# Patient Record
Sex: Female | Born: 1980 | Race: White | Hispanic: No | Marital: Married | Smoking: Never smoker
Health system: Southern US, Community
[De-identification: ages and names within clinical notes are randomized; demographics above are authoritative.]

## PROBLEM LIST (undated history)

## (undated) DIAGNOSIS — J302 Other seasonal allergic rhinitis: Secondary | ICD-10-CM

## (undated) DIAGNOSIS — K200 Eosinophilic esophagitis: Secondary | ICD-10-CM

## (undated) DIAGNOSIS — C439 Malignant melanoma of skin, unspecified: Secondary | ICD-10-CM

## (undated) HISTORY — DX: Eosinophilic esophagitis: K20.0

## (undated) HISTORY — DX: Malignant melanoma of skin, unspecified: C43.9

## (undated) HISTORY — DX: Other seasonal allergic rhinitis: J30.2

## (undated) HISTORY — PX: MELANOMA EXCISION: SHX5266

---

## 2005-04-23 DIAGNOSIS — C439 Malignant melanoma of skin, unspecified: Secondary | ICD-10-CM

## 2005-04-23 HISTORY — DX: Malignant melanoma of skin, unspecified: C43.9

## 2017-11-29 ENCOUNTER — Encounter: Payer: Self-pay | Admitting: Allergy

## 2017-11-29 ENCOUNTER — Ambulatory Visit (INDEPENDENT_AMBULATORY_CARE_PROVIDER_SITE_OTHER): Payer: BC Managed Care – PPO | Admitting: Allergy

## 2017-11-29 VITALS — BP 100/70 | HR 75 | Temp 98.3°F | Resp 16 | Ht 64.0 in | Wt 165.2 lb

## 2017-11-29 DIAGNOSIS — J309 Allergic rhinitis, unspecified: Secondary | ICD-10-CM | POA: Diagnosis not present

## 2017-11-29 DIAGNOSIS — T63481A Toxic effect of venom of other arthropod, accidental (unintentional), initial encounter: Secondary | ICD-10-CM | POA: Diagnosis not present

## 2017-11-29 DIAGNOSIS — J4520 Mild intermittent asthma, uncomplicated: Secondary | ICD-10-CM

## 2017-11-29 DIAGNOSIS — H1010 Acute atopic conjunctivitis, unspecified eye: Secondary | ICD-10-CM

## 2017-11-29 DIAGNOSIS — K200 Eosinophilic esophagitis: Secondary | ICD-10-CM

## 2017-11-29 MED ORDER — AUVI-Q 0.3 MG/0.3ML IJ SOAJ: 0.3000 mg | Freq: Once | INTRAMUSCULAR | 1 refills | Status: AC

## 2017-11-29 MED ORDER — ALBUTEROL SULFATE HFA 108 (90 BASE) MCG/ACT IN AERS: 2.0000 | INHALATION_SPRAY | Freq: Four times a day (QID) | RESPIRATORY_TRACT | 2 refills | Status: DC | PRN

## 2017-11-29 NOTE — Progress Notes (Signed)
New Patient Note  RE: Lauren Fletcher MRN: [id number] DOB: [birth year] Date of Office Visit: 11/29/2017  Referring provider: No ref. provider found Primary care provider: No primary care provider on file.  Chief Complaint: bee sting  History of present illness: Lauren Fletcher is a 37 y.o. female presenting today for evaluation of bee sting.   She was stung June 30th on left posterior thigh.  She had large swelling and redness that progressed over 2-3 days after sting.  Swelling did not cross hip or knee joint.  She denies having any remote areas involves.  Denies any GI, respiratory or CV related symptoms after sting.  She states she took benadryl and applied a topical product to help reduce itch and pain but states it did not help.   She believes she was stung by a wasp.   She states she had been stung before this incident but did not have any symptoms except for very small local redness and swelling at sting site.  She is nervous that she may have a more severe reaction with next sting.    She does report symptoms of sneezing, itchy eyes, nasal congestion and pressure that can be year-round.  She will use nasal spray (flonase), claritin and mucinex to help with control of symptoms.  She states these medications work well enough for her.    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  She reports having SOB and chest tightness related to allergen exposures.  She has had an albuterol inhaler due SOB and chest tightness which is relieved with albuterol.  She states she has not required use of albuterol in over a year however.  Denies any nighttime awakenings.  Denies any need for oral steroids, ED or urgent care visits or hospitalizations.  She does have a history of eczema that is exacerbated by stress.   She normally lets the eczema flare run its course and does not apply anything topically other than moisturization.  She has had esophageal dilation in college around 4132 for  eosinophilic esophagitis.  She does not have an established GI here at this time.  Foods that she reports having issues with our bananas, liquid/juice of beans, dairy, nuts (PB) and states that she will have trouble swallowing reflect these foods are getting impacted.    She is originally from [place] and moved to [place] area about 3 yrs ago.   Review of systems: Review of Systems  Constitutional: Negative for chills, fever and malaise/fatigue.  HENT: Negative for congestion, ear discharge, ear pain, nosebleeds and sore throat.   Eyes: Negative for pain, discharge and redness.  Respiratory: Negative for cough, shortness of breath and wheezing.   Cardiovascular: Negative for chest pain.  Gastrointestinal: Negative for abdominal pain, constipation, diarrhea, heartburn, nausea and vomiting.  Musculoskeletal: Negative for joint pain.  Skin: Negative for itching and rash.  Neurological: Negative for headaches.    All other systems negative unless noted above in HPI  Past medical history: Past Medical History:  Diagnosis Date  . Eosinophilic esophagitis   . Melanoma ([place])   . Seasonal allergies     Past surgical history: Past Surgical History:  Procedure Laterality Date  . MELANOMA EXCISION     Laser surgery    Family history:  History reviewed. No pertinent family history.  Social history: She lives in a home with out carpeting with electric heating and central cooling.  There are no pets in the home there is no concern for water  damage, mildew or roaches in the home.  She is a [employer].  She denies a smoking history.  Medication List: Allergies as of 11/29/2017   No Known Allergies     Medication List    as of 11/29/2017 12:11 PM   You have not been prescribed any medications.     Known medication allergies: No Known Allergies   Physical examination: Blood pressure 100/70, pulse 75, temperature 98.3 F (36.8 C), temperature source Oral, resp. rate 16,  height 5\' 4"  (1.626 m), weight 165 lb 3.2 oz (74.9 kg), last menstrual period 11/15/2017, SpO2 99 %.  General: Alert, interactive, in no acute distress. HEENT: PERRLA, TMs pearly gray, turbinates minimally edematous without discharge, post-pharynx non erythematous. Neck: Supple without lymphadenopathy. Lungs: Clear to auscultation without wheezing, rhonchi or rales. {no increased work of breathing. CV: Normal S1, S2 without murmurs. Abdomen: Nondistended, nontender. Skin: Warm and dry, without lesions or rashes. Extremities:  No clubbing, cyanosis or edema. Neuro:   Grossly intact.  Diagnositics/Labs:  Allergy testing: environmental allergy skin prick testing is positive to grasses, weeds, trees, cats Select food allergy skin prick testing positive to milk, almond, hazelnut, pistachio, navy bean.  Negative to peanut, soybean, wheat, sesame, cashew, pecan, walnut, [place] nut, coconut, oat, hops, banana Allergy testing results were read and interpreted by provider, documented by clinical staff.   Assessment and plan:   Stinging insect large local reaction    - You had a large local reaction to a stinging insect.  You did not develop any systemic symptoms or life threatening symptoms (ie. Difficulty breathing, coughing, wheezing, vomiting, lightheadedness, passing out) following sting thus at this time you do not need to undergo testing or venom immunotherapy (venom injections).  You have the same risk as general population to develop local or systemic symptoms following a subsequent sting.   If you are stung again and do have systemic or life threatening symptoms then you would at that time warrant venom testing and immunotherapy to decrease risk of having severe reaction on subsequent stings   - will prescribe an epinephrine device as pt would like to have access to  One and provided with emergency action plan today   - for local reactions can apply ice to area, topical steroid (ie.  Hydrocortisone) to help with swelling and redness  Allergic rhinoconjunctivitis   - environmental allergy skin prick testing today is positive to weeds, grasses, trees, cat   - allergen avoidance measures discussed/handouts provided   - continue long-acting antihistamine like Claritin, Zyrtec, Allegra or Xyzal daily as needed for allergy symptom control   - for nasal congestion/drainage continue use of OTC nasal steroid spray like Flonase, Rhinocort or Nasocort 2 sprays each nostril daily for 1-2 weeks until symptoms improve    - for itchy/watery/red eyes use OTC Alaway or Zaditor 1 drop each eye 1-2 times a day as needed  Allergic asthma   - have access to albuterol inhaler 2 puffs every 4-6 hours as needed for cough/wheeze/shortness of breath/chest tightness.  May use 15-20 minutes prior to activity.   Monitor frequency of use.     - avoidance measures as above  Eosinophilic esophagitis (EoE)   - history of esophageal dilation due to EoE   - select food allergy skin prick testing today is positive to milk, almond, hazelnut, pistachio, navy bean.  Would recommend avoidance of these foods in the diet.  As above you will have access to an epipenephrine device.     -  recommend continued avoidance of foods that lead to dysphagia   - would recommend establishing care with GI  Follow-up 9-12 months or sooner if needed  I appreciate the opportunity to take part in Lauren Fletcher's care. Please do not hesitate to contact me with questions.  Sincerely,   Prudy Feeler, MD Allergy/Immunology [facility]

## 2017-11-29 NOTE — Patient Instructions (Addendum)
Stinging insect reaction    - You had a large local reaction to a stinging insect.  You did not develop any systemic symptoms or life threatening symptoms (ie. Difficulty breathing, coughing, wheezing, vomiting, lightheadedness, passing out) following sting thus at this time you do not need to undergo testing or venom immunotherapy (venom injections).  You have the same risk as general population to develop local or systemic symptoms following a subsequent sting.   If you are stung again and do have systemic or life threatening symptoms then you would at that time warrant venom testing and immunotherapy to decrease risk of having severe reaction on subsequent stings   - will prescribe an epinephrine device and provided with emergency action plan today   - for local reactions can apply ice to area, topical steroid (ie. Hydrocortisone) to help with swelling and redness  Allergies   - environmental allergy skin prick testing today is positive to weeds, grasses, trees, cat   - allergen avoidance measures discussed/handouts provided   - continue long-acting antihistamine like Claritin, Zyrtec, Allegra or Xyzal daily as needed for allergy symptom control   - for nasal congestion/drainage continue use of OTC nasal steroid spray like Flonase, Rhinocort or Nasocort 2 sprays each nostril daily for 1-2 weeks until symptoms improve    - for itchy/watery/red eyes use OTC Alaway or Zaditor 1 drop each eye 1-2 times a day as needed  Allergic asthma   - have access to albuterol inhaler 2 puffs every 4-6 hours as needed for cough/wheeze/shortness of breath/chest tightness.  May use 15-20 minutes prior to activity.   Monitor frequency of use.     - avoidance measures as above  Eosinophilic esophagitis (EoE)   - history of esophageal dilation due to EoE   - select food allergy skin prick testing today is positive to milk, almond, hazelnut, pistachio, navy bean.  Would recommend avoidance of these foods in the diet.   As above you will have access to an epipenephrine device.     - recommend continued avoidance of foods that lead to dysphagia   - would recommend establishing care with GI  Follow-up 9-12 months or sooner if needed

## 2017-12-26 ENCOUNTER — Ambulatory Visit: Payer: BC Managed Care – PPO | Admitting: Podiatry

## 2018-07-29 ENCOUNTER — Telehealth: Payer: Self-pay

## 2018-07-29 MED ORDER — E-Z SPACER DEVI: 2 refills | Status: AC

## 2018-07-29 MED ORDER — ALBUTEROL SULFATE HFA 108 (90 BASE) MCG/ACT IN AERS: 2.0000 | INHALATION_SPRAY | Freq: Four times a day (QID) | RESPIRATORY_TRACT | 1 refills | Status: AC | PRN

## 2018-07-29 NOTE — Telephone Encounter (Signed)
Patient called stating when she was seen last year the doctor offered to give her an inhaler and she told them she didn't think she needed it. Patient states with the pollen this year she really needs an inhaler.  Please Advise  [facility]

## 2019-02-23 ENCOUNTER — Other Ambulatory Visit: Payer: Self-pay

## 2019-02-23 DIAGNOSIS — Z20822 Contact with and (suspected) exposure to covid-19: Secondary | ICD-10-CM

## 2019-02-24 LAB — NOVEL CORONAVIRUS, NAA: SARS-CoV-2, NAA: NOT DETECTED

## 2019-05-27 ENCOUNTER — Ambulatory Visit: Payer: Self-pay | Attending: Internal Medicine

## 2019-05-27 DIAGNOSIS — Z20822 Contact with and (suspected) exposure to covid-19: Secondary | ICD-10-CM

## 2019-05-28 LAB — NOVEL CORONAVIRUS, NAA: SARS-CoV-2, NAA: NOT DETECTED

## 2019-06-01 ENCOUNTER — Ambulatory Visit: Payer: BC Managed Care – PPO | Attending: Internal Medicine

## 2019-06-01 DIAGNOSIS — Z20822 Contact with and (suspected) exposure to covid-19: Secondary | ICD-10-CM | POA: Insufficient documentation

## 2019-06-02 LAB — NOVEL CORONAVIRUS, NAA: SARS-CoV-2, NAA: NOT DETECTED

## 2019-12-18 ENCOUNTER — Other Ambulatory Visit: Payer: Self-pay

## 2019-12-29 ENCOUNTER — Other Ambulatory Visit: Payer: Self-pay

## 2019-12-29 ENCOUNTER — Other Ambulatory Visit: Payer: BC Managed Care – PPO

## 2019-12-29 DIAGNOSIS — Z20822 Contact with and (suspected) exposure to covid-19: Secondary | ICD-10-CM

## 2019-12-30 LAB — NOVEL CORONAVIRUS, NAA: SARS-CoV-2, NAA: NOT DETECTED

## 2020-04-05 ENCOUNTER — Other Ambulatory Visit: Payer: BC Managed Care – PPO

## 2020-04-05 DIAGNOSIS — Z20822 Contact with and (suspected) exposure to covid-19: Secondary | ICD-10-CM

## 2020-04-06 LAB — NOVEL CORONAVIRUS, NAA: SARS-CoV-2, NAA: NOT DETECTED

## 2020-04-06 LAB — SARS-COV-2, NAA 2 DAY TAT

## 2020-04-12 ENCOUNTER — Other Ambulatory Visit: Payer: BC Managed Care – PPO

## 2020-04-12 DIAGNOSIS — Z20822 Contact with and (suspected) exposure to covid-19: Secondary | ICD-10-CM

## 2020-04-13 LAB — SARS-COV-2, NAA 2 DAY TAT

## 2020-04-13 LAB — NOVEL CORONAVIRUS, NAA: SARS-CoV-2, NAA: NOT DETECTED

## 2020-04-25 ENCOUNTER — Other Ambulatory Visit: Payer: BC Managed Care – PPO

## 2020-04-25 DIAGNOSIS — Z20822 Contact with and (suspected) exposure to covid-19: Secondary | ICD-10-CM

## 2020-04-26 ENCOUNTER — Ambulatory Visit
Admission: RE | Admit: 2020-04-26 | Discharge: 2020-04-26 | Disposition: A | Discharge status: Home / Self Care | Payer: BC Managed Care – PPO | Source: Ambulatory Visit | Attending: Otolaryngology | Admitting: Otolaryngology

## 2020-04-26 ENCOUNTER — Other Ambulatory Visit: Payer: Self-pay

## 2020-04-26 ENCOUNTER — Other Ambulatory Visit: Payer: Self-pay | Admitting: Otolaryngology

## 2020-04-26 DIAGNOSIS — R590 Localized enlarged lymph nodes: Secondary | ICD-10-CM

## 2020-04-26 LAB — SARS-COV-2, NAA 2 DAY TAT

## 2020-04-26 LAB — NOVEL CORONAVIRUS, NAA: SARS-CoV-2, NAA: NOT DETECTED

## 2020-04-26 MED ORDER — IOPAMIDOL (ISOVUE-300) INJECTION 61%
75.0000 mL | Freq: Once | INTRAVENOUS | Status: AC | PRN
  Administered 2020-04-26: 75 mL via INTRAVENOUS

## 2020-04-27 ENCOUNTER — Other Ambulatory Visit (HOSPITAL_COMMUNITY)
Admission: RE | Admit: 2020-04-27 | Discharge: 2020-04-27 | Disposition: A | Discharge status: Home / Self Care | Payer: BC Managed Care – PPO | Source: Ambulatory Visit | Attending: Otolaryngology | Admitting: Otolaryngology

## 2020-04-27 DIAGNOSIS — Z20822 Contact with and (suspected) exposure to covid-19: Secondary | ICD-10-CM | POA: Insufficient documentation

## 2020-04-27 DIAGNOSIS — Z85820 Personal history of malignant melanoma of skin: Secondary | ICD-10-CM | POA: Diagnosis not present

## 2020-04-27 DIAGNOSIS — Z01812 Encounter for preprocedural laboratory examination: Secondary | ICD-10-CM | POA: Insufficient documentation

## 2020-04-27 DIAGNOSIS — I888 Other nonspecific lymphadenitis: Secondary | ICD-10-CM | POA: Diagnosis not present

## 2020-04-27 DIAGNOSIS — R590 Localized enlarged lymph nodes: Secondary | ICD-10-CM | POA: Diagnosis present

## 2020-04-27 LAB — SARS CORONAVIRUS 2 (TAT 6-24 HRS): SARS Coronavirus 2: NEGATIVE

## 2020-04-27 NOTE — H&P (Signed)
HPI:   Lauren Fletcher is a 40 y.o. female who presents as a return Patient.   Current problem: Neck nodes.  HPI: Return visit. She completed the antibiotics and does not feel any better. She feels that the swelling may have gotten worse. She had some low-grade fever the first couple of weeks during this process but no longer has that. She had some repeat blood work done which I have reviewed. Her platelet count has normalized but her white blood cell count is still low.  PMH/Meds/All/SocHx/FamHx/ROS:   History reviewed. No pertinent past medical history.  Past Surgical History:  Procedure Laterality Date  . melanoma removal   No family history of bleeding disorders, wound healing problems or difficulty with anesthesia.   Social History   Socioeconomic History  . Marital status: Married  Spouse name: Not on file  . Number of children: Not on file  . Years of education: Not on file  . Highest education level: Not on file  Occupational History  . Not on file  Tobacco Use  . Smoking status: Never Smoker  . Smokeless tobacco: Never Used  Vaping Use  . Vaping Use: Never used  Substance and Sexual Activity  . Alcohol use: Not on file  . Drug use: Not on file  . Sexual activity: Not on file  Other Topics Concern  . Not on file  Social History Narrative  . Not on file   Social Determinants of Health   Financial Resource Strain: Not on file  Food Insecurity: Not on file  Transportation Needs: Not on file  Physical Activity: Not on file  Stress: Not on file  Social Connections: Not on file  Housing Stability: Not on file   Current Outpatient Medications:  . fluocinonide (VANOS) 0.05 % cream, Apply 1 application topically., Disp: , Rfl:  . fluticasone propionate (FLONASE) 50 mcg/actuation nasal spray, , Disp: , Rfl:    Physical Exam:   On exam she is visibly upset and anxious but very healthy-appearing. Face looks normal. Breathing and voice are clear. The tender  lymphadenopathy in the right lower neck is about the same. I examined her axilla and inguinal areas as well as her abdomen and there is no additional lymphadenopathy or organomegaly palpable.  Independent Review of Additional Tests or Records:  none  Procedures:  none  Impression & Plans:  Persistent lymphadenopathy. Serologic testing for mono and Lyme were negative. She has no exposure to cats. Recommend CT imaging of the neck to further evaluate the characteristics of these nodes and then following that we will likely require either FNA or possibly open biopsy.

## 2020-04-28 ENCOUNTER — Other Ambulatory Visit: Payer: Self-pay | Admitting: Otolaryngology

## 2020-04-28 DIAGNOSIS — R590 Localized enlarged lymph nodes: Secondary | ICD-10-CM

## 2020-04-28 NOTE — Progress Notes (Signed)
SDW call made with instructions only. Verbalized understanding. Patient requested that her brother, Dr. Susy Frizzle Weisinger, be called after the surgery to be updated "since he is a doctor". Note placed in Epic and on paper chart regarding patient's request.

## 2020-04-29 ENCOUNTER — Encounter (HOSPITAL_COMMUNITY)
Admission: RE | Disposition: A | Discharge status: Home / Self Care | Payer: Self-pay | Source: Home / Self Care | Attending: Otolaryngology

## 2020-04-29 ENCOUNTER — Encounter (HOSPITAL_COMMUNITY): Payer: Self-pay | Admitting: Otolaryngology

## 2020-04-29 ENCOUNTER — Other Ambulatory Visit: Payer: Self-pay

## 2020-04-29 ENCOUNTER — Ambulatory Visit (HOSPITAL_COMMUNITY): Payer: BC Managed Care – PPO | Admitting: Certified Registered"

## 2020-04-29 ENCOUNTER — Ambulatory Visit (HOSPITAL_COMMUNITY)
Admission: RE | Admit: 2020-04-29 | Discharge: 2020-04-29 | Disposition: A | Discharge status: Home / Self Care | Payer: BC Managed Care – PPO | Attending: Otolaryngology | Admitting: Otolaryngology

## 2020-04-29 DIAGNOSIS — I888 Other nonspecific lymphadenitis: Secondary | ICD-10-CM | POA: Diagnosis not present

## 2020-04-29 DIAGNOSIS — Z20822 Contact with and (suspected) exposure to covid-19: Secondary | ICD-10-CM | POA: Insufficient documentation

## 2020-04-29 DIAGNOSIS — R590 Localized enlarged lymph nodes: Secondary | ICD-10-CM | POA: Insufficient documentation

## 2020-04-29 DIAGNOSIS — Z85820 Personal history of malignant melanoma of skin: Secondary | ICD-10-CM | POA: Insufficient documentation

## 2020-04-29 HISTORY — PX: LYMPH NODE BIOPSY: SHX201

## 2020-04-29 LAB — POCT PREGNANCY, URINE: Preg Test, Ur: NEGATIVE

## 2020-04-29 SURGERY — LYMPH NODE BIOPSY
Anesthesia: General | Laterality: Right

## 2020-04-29 MED ORDER — PHENYLEPHRINE 40 MCG/ML (10ML) SYRINGE FOR IV PUSH (FOR BLOOD PRESSURE SUPPORT)
PREFILLED_SYRINGE | INTRAVENOUS | Status: DC | PRN
  Administered 2020-04-29: 80 ug via INTRAVENOUS
  Administered 2020-04-29: 120 ug via INTRAVENOUS

## 2020-04-29 MED ORDER — PHENYLEPHRINE 40 MCG/ML (10ML) SYRINGE FOR IV PUSH (FOR BLOOD PRESSURE SUPPORT)
PREFILLED_SYRINGE | INTRAVENOUS | Status: AC
  Filled 2020-04-29: qty 10

## 2020-04-29 MED ORDER — PROPOFOL 10 MG/ML IV BOLUS
INTRAVENOUS | Status: DC | PRN
  Administered 2020-04-29: 150 mg via INTRAVENOUS

## 2020-04-29 MED ORDER — FENTANYL CITRATE (PF) 100 MCG/2ML IJ SOLN
INTRAMUSCULAR | Status: AC
  Filled 2020-04-29: qty 2

## 2020-04-29 MED ORDER — LIDOCAINE 2% (20 MG/ML) 5 ML SYRINGE
INTRAMUSCULAR | Status: AC
  Filled 2020-04-29: qty 10

## 2020-04-29 MED ORDER — CHLORHEXIDINE GLUCONATE 0.12 % MT SOLN
15.0000 mL | Freq: Once | OROMUCOSAL | Status: AC
  Administered 2020-04-29: 15 mL via OROMUCOSAL
  Filled 2020-04-29: qty 15

## 2020-04-29 MED ORDER — ONDANSETRON HCL 4 MG/2ML IJ SOLN: 4.0000 mg | Freq: Once | INTRAMUSCULAR | Status: DC | PRN

## 2020-04-29 MED ORDER — PROMETHAZINE HCL 25 MG RE SUPP: 25.0000 mg | Freq: Four times a day (QID) | RECTAL | 1 refills | Status: DC | PRN

## 2020-04-29 MED ORDER — LIDOCAINE 2% (20 MG/ML) 5 ML SYRINGE
INTRAMUSCULAR | Status: DC | PRN
  Administered 2020-04-29: 60 mg via INTRAVENOUS

## 2020-04-29 MED ORDER — DEXAMETHASONE SODIUM PHOSPHATE 10 MG/ML IJ SOLN
INTRAMUSCULAR | Status: AC
  Filled 2020-04-29: qty 2

## 2020-04-29 MED ORDER — PROPOFOL 10 MG/ML IV BOLUS
INTRAVENOUS | Status: AC
  Filled 2020-04-29: qty 20

## 2020-04-29 MED ORDER — LACTATED RINGERS IV SOLN: INTRAVENOUS | Status: DC

## 2020-04-29 MED ORDER — FENTANYL CITRATE (PF) 250 MCG/5ML IJ SOLN
INTRAMUSCULAR | Status: DC | PRN
  Administered 2020-04-29 (×2): 25 ug via INTRAVENOUS
  Administered 2020-04-29: 50 ug via INTRAVENOUS

## 2020-04-29 MED ORDER — FENTANYL CITRATE (PF) 100 MCG/2ML IJ SOLN
25.0000 ug | INTRAMUSCULAR | Status: DC | PRN
  Administered 2020-04-29: 25 ug via INTRAVENOUS

## 2020-04-29 MED ORDER — LIDOCAINE-EPINEPHRINE 1 %-1:100000 IJ SOLN
INTRAMUSCULAR | Status: AC
  Filled 2020-04-29: qty 1

## 2020-04-29 MED ORDER — MIDAZOLAM HCL 2 MG/2ML IJ SOLN
INTRAMUSCULAR | Status: AC
  Filled 2020-04-29: qty 2

## 2020-04-29 MED ORDER — MIDAZOLAM HCL 5 MG/5ML IJ SOLN
INTRAMUSCULAR | Status: DC | PRN
  Administered 2020-04-29: 2 mg via INTRAVENOUS

## 2020-04-29 MED ORDER — ONDANSETRON HCL 4 MG/2ML IJ SOLN
INTRAMUSCULAR | Status: AC
  Filled 2020-04-29: qty 2

## 2020-04-29 MED ORDER — LIDOCAINE-EPINEPHRINE 1 %-1:100000 IJ SOLN
INTRAMUSCULAR | Status: DC | PRN
  Administered 2020-04-29: 2 mL

## 2020-04-29 MED ORDER — ONDANSETRON HCL 4 MG/2ML IJ SOLN
INTRAMUSCULAR | Status: DC | PRN
  Administered 2020-04-29: 4 mg via INTRAVENOUS

## 2020-04-29 MED ORDER — HYDROCODONE-ACETAMINOPHEN 7.5-325 MG PO TABS: 1.0000 | ORAL_TABLET | Freq: Four times a day (QID) | ORAL | 0 refills | Status: DC | PRN

## 2020-04-29 MED ORDER — EPHEDRINE 5 MG/ML INJ
INTRAVENOUS | Status: AC
  Filled 2020-04-29: qty 10

## 2020-04-29 MED ORDER — FENTANYL CITRATE (PF) 250 MCG/5ML IJ SOLN
INTRAMUSCULAR | Status: AC
  Filled 2020-04-29: qty 5

## 2020-04-29 MED ORDER — DEXAMETHASONE SODIUM PHOSPHATE 10 MG/ML IJ SOLN
INTRAMUSCULAR | Status: DC | PRN
  Administered 2020-04-29: 5 mg via INTRAVENOUS

## 2020-04-29 MED ORDER — ROCURONIUM BROMIDE 10 MG/ML (PF) SYRINGE
PREFILLED_SYRINGE | INTRAVENOUS | Status: AC
  Filled 2020-04-29: qty 20

## 2020-04-29 MED ORDER — ORAL CARE MOUTH RINSE: 15.0000 mL | Freq: Once | OROMUCOSAL | Status: AC

## 2020-04-29 SURGICAL SUPPLY — 33 items
APPLIER CLIP 9.375 SM OPEN (CLIP)
CANISTER SUCT 3000ML PPV (MISCELLANEOUS) ×2 IMPLANT
CLEANER TIP ELECTROSURG 2X2 (MISCELLANEOUS) ×2 IMPLANT
CLIP APPLIE 9.375 SM OPEN (CLIP) IMPLANT
CNTNR URN SCR LID CUP LEK RST (MISCELLANEOUS) ×1 IMPLANT
CONT SPEC 4OZ STRL OR WHT (MISCELLANEOUS) ×1
CORD BIPOLAR FORCEPS 12FT (ELECTRODE) IMPLANT
COVER SURGICAL LIGHT HANDLE (MISCELLANEOUS) ×2 IMPLANT
COVER WAND RF STERILE (DRAPES) ×2 IMPLANT
DERMABOND ADVANCED (GAUZE/BANDAGES/DRESSINGS) ×1
DERMABOND ADVANCED .7 DNX12 (GAUZE/BANDAGES/DRESSINGS) ×1 IMPLANT
DRAIN HEMOVAC 7FR (DRAIN) IMPLANT
ELECT COATED BLADE 2.86 ST (ELECTRODE) ×2 IMPLANT
ELECT REM PT RETURN 9FT ADLT (ELECTROSURGICAL) ×2
ELECTRODE REM PT RTRN 9FT ADLT (ELECTROSURGICAL) ×1 IMPLANT
EVACUATOR SILICONE 100CC (DRAIN) IMPLANT
GAUZE 4X4 16PLY RFD (DISPOSABLE) IMPLANT
GLOVE ECLIPSE 7.5 STRL STRAW (GLOVE) ×2 IMPLANT
GOWN STRL REUS W/ TWL LRG LVL3 (GOWN DISPOSABLE) ×2 IMPLANT
GOWN STRL REUS W/TWL LRG LVL3 (GOWN DISPOSABLE) ×2
KIT BASIN OR (CUSTOM PROCEDURE TRAY) ×2 IMPLANT
KIT TURNOVER KIT B (KITS) ×2 IMPLANT
NEEDLE 27GAX1/2IN MONOJET (NEEDLE) ×2 IMPLANT
NS IRRIG 1000ML POUR BTL (IV SOLUTION) ×2 IMPLANT
PAD ARMBOARD 7.5X6 YLW CONV (MISCELLANEOUS) ×4 IMPLANT
PENCIL FOOT CONTROL (ELECTRODE) ×2 IMPLANT
SPONGE INTESTINAL PEANUT (DISPOSABLE) IMPLANT
STAPLER VISISTAT 35W (STAPLE) ×2 IMPLANT
SUT CHROMIC 3 0 SH 27 (SUTURE) ×2 IMPLANT
SUT ETHILON 3 0 PS 1 (SUTURE) IMPLANT
SUT SILK 4 0 REEL (SUTURE) ×2 IMPLANT
TOWEL GREEN STERILE FF (TOWEL DISPOSABLE) ×2 IMPLANT
TRAY ENT MC OR (CUSTOM PROCEDURE TRAY) ×2 IMPLANT

## 2020-04-29 NOTE — Interval H&P Note (Signed)
History and Physical Interval Note:  04/29/2020 11:17 AM  Lauren Fletcher  has presented today for surgery, with the diagnosis of Acute lymphadenitis Cervical lymphadenopathy.  The various methods of treatment have been discussed with the patient and family. After consideration of risks, benefits and other options for treatment, the patient has consented to  Procedure(s): Right Neck Lymph Node Excisional Biopsy (Right) as a surgical intervention.  The patient's history has been reviewed, patient examined, no change in status, stable for surgery.  I have reviewed the patient's chart and labs.  Questions were answered to the patient's satisfaction.     [name]

## 2020-04-29 NOTE — Op Note (Signed)
OPERATIVE REPORT  DATE OF SURGERY: 04/29/2020  PATIENT:  Lauren Fletcher,  40 y.o. female  PRE-OPERATIVE DIAGNOSIS:   Cervical lymphadenopathy  POST-OPERATIVE DIAGNOSIS:   Cervical lymphadenopathy  PROCEDURE:  Procedure(s): Right Neck Lymph Node Excisional Biopsy  SURGEON:  Beckie Salts, MD  ASSISTANTS: None  ANESTHESIA:   General   EBL: 10 ml  DRAINS: None  LOCAL MEDICATIONS USED: 1% Xylocaine with epinephrine  SPECIMEN: Right level 5 cervical lymph nodes, frozen section analysis consistent with lymphoid process, favor reactive.  No carcinoma identified.  Lymphoma work-up ordered.  COUNTS:  Correct  PROCEDURE DETAILS: The patient was taken to the operating room and placed on the operating table in the supine position. Following induction of general endotracheal anesthesia, using laryngeal mask airway, the neck was prepped and draped in the standard fashion.  The previous scar from her melanoma excision years ago was identified and was marked with a marking pen.  This was overlying 2 palpable lymph nodes.  This was infiltrated with local anesthetic solution and #15 scalpel was used to incise the skin and subcutaneous tissue.  A self-retaining retractor was used for the remainder of the case.  Blunt dissection was used to dissect through the superficial fibrofatty tissue and scar tissue down towards the palpable lymph nodes.  A nerve structure was identified just deep to the lymph nodes and was felt to probably be the spinal accessory nerve and was left unmolested.  2 nodes were removed using electrocautery dissection.  These were sent for frozen section analysis.  The wound was irrigated with saline and hemostasis was completed using cautery.  The incision was closed in layers using interrupted 3-0 chromic in a running subcuticular 3-0 chromic.  Dermabond was used on the skin.  Patient was awakened extubated and transferred recovery in stable condition    PATIENT DISPOSITION:   To PACU, stable

## 2020-04-29 NOTE — Anesthesia Postprocedure Evaluation (Signed)
Anesthesia Post Note  Patient: Lauren Fletcher  Procedure(s) Performed: Right Neck Lymph Node Excisional Biopsy (Right )     Patient location during evaluation: PACU Anesthesia Type: General Level of consciousness: awake and alert, oriented and patient cooperative Pain management: pain level controlled Vital Signs Assessment: post-procedure vital signs reviewed and stable Respiratory status: spontaneous breathing, nonlabored ventilation and respiratory function stable Cardiovascular status: blood pressure returned to baseline and stable Postop Assessment: no apparent nausea or vomiting Anesthetic complications: no   No complications documented.  Last Vitals:  Vitals:   04/29/20 1005 04/29/20 1250  BP: 115/79 109/79  Pulse: 84 72  Resp: 18 12  Temp: 36.8 C   SpO2: 99% 100%    Last Pain:  Vitals:   04/29/20 1250  TempSrc:   PainSc: 0-No pain                 Pervis Hocking

## 2020-04-29 NOTE — Anesthesia Preprocedure Evaluation (Addendum)
Anesthesia Evaluation  Patient identified by MRN, date of birth, ID band Patient awake    Reviewed: Allergy & Precautions, NPO status , Patient's Chart, lab work & pertinent test results  Airway Mallampati: II  TM Distance: >3 FB Neck ROM: Full    Dental no notable dental hx. (+) Teeth Intact   Pulmonary neg pulmonary ROS,    Pulmonary exam normal breath sounds clear to auscultation       Cardiovascular negative cardio ROS Normal cardiovascular exam Rhythm:Regular Rate:Normal     Neuro/Psych negative neurological ROS  negative psych ROS   GI/Hepatic negative GI ROS, Neg liver ROS,   Endo/Other  negative endocrine ROS  Renal/GU negative Renal ROS  negative genitourinary   Musculoskeletal Hx/o melanoma excision right neck Cervical lymphadenopathy right Psoriasis- used topicals   Abdominal   Peds  Hematology negative hematology ROS (+)   Anesthesia Other Findings Butterfly rash of face  Reproductive/Obstetrics                           Anesthesia Physical Anesthesia Plan  ASA: II  Anesthesia Plan: General   Post-op Pain Management:    Induction: Intravenous  PONV Risk Score and Plan: 3 and Treatment may vary due to age or medical condition and Ondansetron  Airway Management Planned: LMA  Additional Equipment:   Intra-op Plan:   Post-operative Plan: Extubation in OR  Informed Consent: I have reviewed the patients History and Physical, chart, labs and discussed the procedure including the risks, benefits and alternatives for the proposed anesthesia with the patient or authorized representative who has indicated his/her understanding and acceptance.     Dental advisory given  Plan Discussed with: Anesthesiologist and CRNA  Anesthesia Plan Comments:         Anesthesia Quick Evaluation

## 2020-04-29 NOTE — Transfer of Care (Signed)
Immediate Anesthesia Transfer of Care Note  Patient: Lauren Fletcher  Procedure(s) Performed: Right Neck Lymph Node Excisional Biopsy (Right )  Patient Location: PACU  Anesthesia Type:General  Level of Consciousness: awake, alert  and oriented  Airway & Oxygen Therapy: Patient Spontanous Breathing and Patient connected to nasal cannula oxygen  Post-op Assessment: Report given to RN and Post -op Vital signs reviewed and stable  Post vital signs: Reviewed and stable  Last Vitals:  Vitals Value Taken Time  BP 109/79 04/29/20 1248  Temp    Pulse 69 04/29/20 1251  Resp 13 04/29/20 1251  SpO2 100 % 04/29/20 1251  Vitals shown include unvalidated device data.  Last Pain:  Vitals:   04/29/20 1005  TempSrc: Oral      Patients Stated Pain Goal: 3 (38/45/36 4680)  Complications: No complications documented.

## 2020-04-29 NOTE — Anesthesia Procedure Notes (Signed)
Procedure Name: LMA Insertion Date/Time: 04/29/2020 11:54 AM Performed by: Imagene Riches, CRNA Pre-anesthesia Checklist: Patient identified, Emergency Drugs available, Suction available and Patient being monitored Patient Re-evaluated:Patient Re-evaluated prior to induction Oxygen Delivery Method: Circle System Utilized Preoxygenation: Pre-oxygenation with 100% oxygen Induction Type: IV induction Ventilation: Mask ventilation without difficulty LMA: LMA inserted LMA Size: 4.0 Number of attempts: 1 Airway Equipment and Method: Bite block Placement Confirmation: positive ETCO2 Tube secured with: Tape Dental Injury: Teeth and Oropharynx as per pre-operative assessment

## 2020-04-29 NOTE — Discharge Instructions (Signed)
You may shower and use soap and water. Do not use any creams, oils or ointment. ° °

## 2020-04-30 ENCOUNTER — Encounter (HOSPITAL_COMMUNITY): Payer: Self-pay | Admitting: Otolaryngology

## 2020-05-03 ENCOUNTER — Other Ambulatory Visit: Payer: Self-pay

## 2020-05-10 LAB — SURGICAL PATHOLOGY

## 2020-05-23 NOTE — Progress Notes (Addendum)
Office Visit Note  Patient: Lauren Fletcher             Date of Birth: [birth year]           MRN: [id number]             PCP: Scheryl Marten, PA Referring: [name], MD Visit Date: 06/02/2020 Occupation: @GUAROCC @  Subjective:  Lymphadenitis and pancytopenia.   History of Present Illness: Lauren Fletcher is a 40 y.o. female seen in consultation per request of Dr. Constance Holster.  According the patient on March 30, 2020 she developed some swollen lymph nodes underneath the surgical scar where she had melanoma in 2007.  She also developed increased fatigue and decreased appetite.  She had test for COVID-19 which was negative.  She also had a pregnancy test which was negative.  She was seen by her PCP who did extensive work-up including testing for EBV, RMSF and Lyme titers which were negative per patient.  She states she was also found to have a rash underneath her ear for which she was seen by dermatology PA who diagnosed it to be a psoriasis patch.  She states she had a shingles test which was negative although it was done incorrectly palpation.  She states her PCP did some lab work which showed neutropenia and thrombocytopenia.  The labs were repeated 2 weeks later and the neutropenia persists but the thrombocytopenia corrected.  She was referred to Dr. Constance Holster who did CT scan and then decided to open biopsy of the lymph node on April 29, 2020.  She states since she had the surgery she has some irritation in the scar area she also has difficulty swallowing.  For the last week she has been feeling some abdominal tightness.  She denies any diarrhea or blood in her stool.  She states the fatigue has improved.  She is also noticed a rash on her face for the last several months.  She states she had psoriasis as a child which was mostly on her scalp and her elbows.  She uses topical agents for her scalp now.  There is no family history of autoimmune disease and there is no family history of  psoriasis.  She does not have cats.  She denies any history of infection prior to onset of these symptoms.  There is no history of oral ulcers, nasal ulcers.  She states her 1 eyes dry in the morning when she gets up.  There is no history of shortness of breath, chest pain palpitations, Raynaud's phenomenon.  She gives history of photosensitivity.  She states that sometimes her left knee joint hurts but she has not noticed any swelling.  Activities of Daily Living:  Patient reports morning stiffness for 0 minutes.   Patient Denies nocturnal pain.  Difficulty dressing/grooming: Denies Difficulty climbing stairs: Denies Difficulty getting out of chair: Denies Difficulty using hands for taps, buttons, cutlery, and/or writing: Denies  Review of Systems  Constitutional: Positive for fatigue. Negative for night sweats, weight gain and weight loss.  HENT: Negative for mouth sores, trouble swallowing, trouble swallowing, mouth dryness and nose dryness.   Eyes: Positive for dryness. Negative for pain, redness, itching and visual disturbance.  Respiratory: Negative for cough, shortness of breath and difficulty breathing.   Cardiovascular: Negative for chest pain, palpitations, hypertension, irregular heartbeat and swelling in legs/feet.  Gastrointestinal: Negative for blood in stool, constipation and diarrhea.  Endocrine: Negative for increased urination.  Genitourinary: Negative for difficulty urinating and vaginal dryness.  Musculoskeletal:  Positive for arthralgias and joint pain. Negative for joint swelling, myalgias, muscle weakness, morning stiffness, muscle tenderness and myalgias.  Skin: Positive for rash and sensitivity to sunlight. Negative for color change, hair loss, skin tightness and ulcers.  Allergic/Immunologic: Negative for susceptible to infections.  Neurological: Negative for dizziness, numbness, headaches, memory loss, night sweats and weakness.  Hematological: Positive for swollen  glands. Negative for bruising/bleeding tendency.  Psychiatric/Behavioral: Positive for depressed mood. Negative for confusion and sleep disturbance. The patient is nervous/anxious.     PMFS History:  There are no problems to display for this patient.   Past Medical History:  Diagnosis Date  . Eosinophilic esophagitis   . Melanoma ([place])   . Seasonal allergies     Family History  Problem Relation Age of Onset  . Healthy Mother   . Healthy Father   . Healthy Brother    Past Surgical History:  Procedure Laterality Date  . LYMPH NODE BIOPSY Right 04/29/2020   Procedure: Right Neck Lymph Node Excisional Biopsy;  Surgeon: [name], MD;  Location: [facility];  Service: ENT;  Laterality: Right;  . MELANOMA EXCISION     Laser surgery   Social History   Social History Narrative  . Not on file   Immunization History  Administered Date(s) Administered  . PFIZER(Purple Top)SARS-COV-2 Vaccination 06/21/2019, 07/13/2019, 05/12/2020     Objective: Vital Signs: BP 123/83 (BP Location: Right Arm, Patient Position: Sitting, Cuff Size: Normal)   Pulse 67   Resp 13   Ht 5' 4.25" (1.632 m)   Wt 156 lb 12.8 oz (71.1 kg)   BMI 26.71 kg/m    Physical Exam Vitals and nursing note reviewed.  Constitutional:      Appearance: She is well-developed and well-nourished.  HENT:     Head: Normocephalic and atraumatic.  Eyes:     Extraocular Movements: EOM normal.     Conjunctiva/sclera: Conjunctivae normal.  Cardiovascular:     Rate and Rhythm: Normal rate and regular rhythm.     Pulses: Intact distal pulses.     Heart sounds: Normal heart sounds.  Pulmonary:     Effort: Pulmonary effort is normal.     Breath sounds: Normal breath sounds.  Abdominal:     General: Bowel sounds are normal.     Palpations: Abdomen is soft.  Musculoskeletal:     Cervical back: Normal range of motion.  Lymphadenopathy:     Cervical: No cervical adenopathy.  Skin:    General: Skin is warm and dry.      Capillary Refill: Capillary refill takes less than 2 seconds.     Findings: Rash present.     Comments: Facial erytheme  Neurological:     Mental Status: She is alert and oriented to person, place, and time.  Psychiatric:        Mood and Affect: Mood and affect normal.        Behavior: Behavior normal.      Musculoskeletal Exam: C-spine thoracic and lumbar spine with good range of motion.  Shoulder joints, elbow joints, wrist joints, MCPs PIPs and DIPs with good range of motion with no synovitis.  Hip joints, knee joints, ankles, MTPs and PIPs with good range of motion with no synovitis.  CDAI Exam: CDAI Score: - Patient Global: -; Provider Global: - Swollen: -; Tender: - Joint Exam 06/02/2020   No joint exam has been documented for this visit   There is currently no information documented on the homunculus. Go to the  Rheumatology activity and complete the homunculus joint exam.  Investigation: No additional findings.  Imaging: No results found.  Recent Labs: No results found for: WBC, HGB, PLT, NA, K, CL, CO2, GLUCOSE, BUN, CREATININE, BILITOT, ALKPHOS, AST, ALT, PROT, ALBUMIN, CALCIUM, GFRAA, QFTBGOLD, QFTBGOLDPLUS  Speciality Comments: No specialty comments available.  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  This could be Kikuchi disease.  Cervical lymphadenopathy -histiocytic necrotizing lymphadenitis can be postviral.  Patient had EBV, RMSF and Lyme titers done by her PCP per patient.  I will obtain some additional labs today.  Plan: Parvovirus B19 antibody, IgG and IgM, Toxoplasma gondii antibody, IgM  Rash -she has facial erythema on her face and on her forehead.  She is concerned about autoimmune disease like lupus.  I  will obtain following labs today.  Plan: Urinalysis, Routine w reflex microscopic, Sedimentation rate, CK, ANA, Anti-DNA antibody, double-stranded, Anti-Smith antibody, Parvovirus B19 antibody, IgG and IgM  Pancytopenia (HCC) -she had below white cell count and platelets initially.  The platelets recovered in 2 weeks but the neutropenia persist.  She has not had any labs since December 2021.  We will check labs today.  Her initial WBC count on April 07, 2020 was 2.8 and on December 29 was 2.6.  Her platelets on April 07, 2020 were 139 and repeat on April 20, 2020 were 192.  Plan: CBC with Differential/Platelet  History of malignant melanoma of skin - right side of the neck, 10/2005 resection. She is followed by Dr. Renda Rolls in [place].  History of psoriasis-followed by dermatologist.  Patient states she had psoriasis as a child which persist.  She has mild psoriasis in her scalp now for which she uses topical agents.  She had no psoriasis lesions present today.  Other fatigue -she initially had increased fatigue.  She states the fatigue has improved.  We will check the following labs today.  Plan: CBC with Differential/Platelet, COMPLETE METABOLIC PANEL WITH GFR, CK, TSH  Orders: Orders Placed This Encounter  Procedures  . CBC with Differential/Platelet  . COMPLETE METABOLIC PANEL WITH GFR  . Urinalysis, Routine w reflex microscopic  . Sedimentation rate  . CK  . TSH  . ANA  . Anti-DNA antibody, double-stranded  . Anti-Smith antibody  . Parvovirus B19 antibody, IgG and IgM  . Toxoplasma gondii antibody, IgM   No orders of the defined types were placed in this encounter.     Follow-Up Instructions: Return for Lymphadenitis.   Bo Merino, MD  Note - This record has been created using Editor, commissioning.  Chart creation errors have been sought, but may not always  have been located. Such creation errors do not reflect on  the standard of medical care.

## 2020-06-02 ENCOUNTER — Other Ambulatory Visit: Payer: Self-pay

## 2020-06-02 ENCOUNTER — Ambulatory Visit (INDEPENDENT_AMBULATORY_CARE_PROVIDER_SITE_OTHER): Payer: BC Managed Care – PPO | Admitting: Rheumatology

## 2020-06-02 ENCOUNTER — Encounter: Payer: Self-pay | Admitting: Rheumatology

## 2020-06-02 VITALS — BP 123/83 | HR 67 | Resp 13 | Ht 64.25 in | Wt 156.8 lb

## 2020-06-02 DIAGNOSIS — I881 Chronic lymphadenitis, except mesenteric: Secondary | ICD-10-CM

## 2020-06-02 DIAGNOSIS — R590 Localized enlarged lymph nodes: Secondary | ICD-10-CM

## 2020-06-02 DIAGNOSIS — D61818 Other pancytopenia: Secondary | ICD-10-CM

## 2020-06-02 DIAGNOSIS — R21 Rash and other nonspecific skin eruption: Secondary | ICD-10-CM

## 2020-06-02 DIAGNOSIS — Z872 Personal history of diseases of the skin and subcutaneous tissue: Secondary | ICD-10-CM

## 2020-06-02 DIAGNOSIS — Z85820 Personal history of malignant melanoma of skin: Secondary | ICD-10-CM

## 2020-06-02 DIAGNOSIS — R5383 Other fatigue: Secondary | ICD-10-CM

## 2020-06-03 ENCOUNTER — Telehealth: Payer: Self-pay | Admitting: *Deleted

## 2020-06-03 NOTE — Telephone Encounter (Signed)
Labs received from: [facility]  Drawn on: 04/20/2020 Reviewed by: Hazel Sams, PA-C  Labs drawn: CBC, CMP, Mono test, lyme IgM [place] Spotted Fever, IgM  Results: MPV: 7.1    Glucose 104    Creatinine 0.56    WBC 2.6    Neut # 1.4    Lymph # 0.80

## 2020-06-06 ENCOUNTER — Telehealth: Payer: Self-pay

## 2020-06-06 NOTE — Progress Notes (Signed)
Please add ENA, C3 and C4.

## 2020-06-06 NOTE — Progress Notes (Signed)
Office Visit Note  Patient: Lauren Fletcher             Date of Birth: 1980/07/18           MRN: [id number]             PCP: Scheryl Marten, PA Referring: Scheryl Marten, [place] Visit Date: 06/08/2020 Occupation: @GUAROCC @  Subjective:  Enlarged lymph nodes and positive ANA.   History of Present Illness: Lauren Fletcher is a 40 y.o. female with history of histiocytic necrotizing lymphadenitis and positive ANA.  She returns for follow-up visit today.  She states she continues to have some periumbilical abdominal pain which radiates to the sides.  She has seen Dr. Collene Mares for the abdominal pain and is scheduled to have a CT scan of her abdomen.  She denies any history of oral ulcers, nasal ulcers, photosensitivity, Raynaud's phenomenon or inflammatory arthritis.  She states she has been having some discomfort in her knee joints which she describes over the medial aspect of her knee off and on.  She denies any history of knee swelling.  She continues to have facial rash.  She states been diagnosed as rosacea by her dermatologist.  When she uses the products on a regular basis the rash improves.  Activities of Daily Living:  Patient reports morning stiffness for 0 minutes.   Patient Denies nocturnal pain.  Difficulty dressing/grooming: Denies Difficulty climbing stairs: Denies Difficulty getting out of chair: Denies Difficulty using hands for taps, buttons, cutlery, and/or writing: Denies  Review of Systems  Constitutional: Positive for fatigue. Negative for night sweats, weight gain and weight loss.  HENT: Negative for mouth sores, trouble swallowing, trouble swallowing, mouth dryness and nose dryness.   Eyes: Negative for pain, redness, itching, visual disturbance and dryness.  Respiratory: Negative for cough, shortness of breath and difficulty breathing.   Cardiovascular: Negative for chest pain, palpitations, hypertension, irregular heartbeat and swelling in legs/feet.   Gastrointestinal: Positive for abdominal pain. Negative for blood in stool, constipation, diarrhea, nausea and vomiting.  Endocrine: Negative for increased urination.  Genitourinary: Negative for difficulty urinating and vaginal dryness.  Musculoskeletal: Positive for arthralgias and joint pain. Negative for joint swelling, myalgias, muscle weakness, morning stiffness, muscle tenderness and myalgias.  Skin: Negative for color change, rash, hair loss, redness, skin tightness, ulcers and sensitivity to sunlight.  Allergic/Immunologic: Negative for susceptible to infections.  Neurological: Negative for dizziness, numbness, headaches, memory loss, night sweats and weakness.  Hematological: Negative for bruising/bleeding tendency and swollen glands.  Psychiatric/Behavioral: Positive for sleep disturbance. Negative for depressed mood and confusion. The patient is nervous/anxious.     PMFS History:  Patient Active Problem List   Diagnosis Date Noted  . 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 History of malignant melanoma of skin 06/08/2020  . History of psoriasis 06/08/2020  . Eosinophilic esophagitis 57/26/2035    Past Medical History:  Diagnosis Date  . Eosinophilic esophagitis   . Melanoma ([place])   . Seasonal allergies     Family History  Problem Relation Age of Onset  . Healthy Mother   . Healthy Father   . Healthy Brother    Past Surgical History:  Procedure Laterality Date  . LYMPH NODE BIOPSY Right 04/29/2020   Procedure: Right Neck Lymph Node Excisional Biopsy;  Surgeon: [name], MD;  Location: [facility];  Service: ENT;  Laterality: Right;  . MELANOMA EXCISION     Laser surgery   Social History   Social History Narrative  .  Not on file   Immunization History  Administered Date(s) Administered  . PFIZER(Purple Top)SARS-COV-2 Vaccination 06/21/2019, 07/13/2019, 05/12/2020     Objective: Vital Signs: BP 120/84 (BP Location: Left Arm, Patient Position:  Sitting, Cuff Size: Normal)   Pulse 77   Resp 13   Ht 5' 4"  (1.626 m)   Wt 157 lb 9.6 oz (71.5 kg)   BMI 27.05 kg/m    Physical Exam Vitals and nursing note reviewed.  Constitutional:      Appearance: She is well-developed and well-nourished.  HENT:     Head: Normocephalic and atraumatic.  Eyes:     Extraocular Movements: EOM normal.     Conjunctiva/sclera: Conjunctivae normal.  Cardiovascular:     Rate and Rhythm: Normal rate and regular rhythm.     Pulses: Intact distal pulses.     Heart sounds: Normal heart sounds.  Pulmonary:     Effort: Pulmonary effort is normal.     Breath sounds: Normal breath sounds.  Abdominal:     General: Bowel sounds are normal.     Palpations: Abdomen is soft.  Musculoskeletal:     Cervical back: Normal range of motion.  Lymphadenopathy:     Cervical: No cervical adenopathy.  Skin:    General: Skin is warm and dry.     Capillary Refill: Capillary refill takes less than 2 seconds.     Comments: Erythema noted over face, forehead and chin.  No nailbed capillary changes were noted.  No sclerodactyly was noted.  Neurological:     Mental Status: She is alert and oriented to person, place, and time.  Psychiatric:        Mood and Affect: Mood and affect normal.        Behavior: Behavior normal.      Musculoskeletal Exam: C-spine was in good range of motion.  Shoulder joints, elbow joints, wrist joints, MCPs PIPs and DIPs with good range of motion with no synovitis.  Hip joints, knee joints, ankles with good range of motion with no synovitis.  She had no tenderness over ankles or MTPs.  CDAI Exam: CDAI Score: -- Patient Global: --; Provider Global: -- Swollen: --; Tender: -- Joint Exam 06/08/2020   No joint exam has been documented for this visit   There is currently no information documented on the homunculus. Go to the Rheumatology activity and complete the homunculus joint exam.  Investigation: No additional findings.  Imaging: No  results found.  Recent Labs: Lab Results  Component Value Date   WBC 4.5 06/02/2020   HGB 15.1 06/02/2020   PLT 239 06/02/2020   NA 140 06/02/2020   K 4.7 06/02/2020   CL 104 06/02/2020   CO2 29 06/02/2020   GLUCOSE 84 06/02/2020   BUN 12 06/02/2020   CREATININE 0.68 06/02/2020   BILITOT 0.6 06/02/2020   AST 21 06/02/2020   ALT 18 06/02/2020   PROT 7.2 06/02/2020   CALCIUM 9.5 06/02/2020   GFRAA 130 06/02/2020   June 02, 2020 urine dipstick showed 3+ hemoglobin, 10-12 RBCs, 6-10 epithelial cells, WBC negative, bacteria negative, ANA 1: 80 NH and NS, dsDNA negative, Smith negative, parvo B19 IgG positive, IgM negative, ESR 6, CK 42, TSH normal, toxo negative Speciality Comments: No specialty comments available.  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  I had detailed discussion with the patient from all the reading I have done that it should be a self-limiting condition.  Most likely etiology could be postviral.  Cervical lymphadenopathy - Patient had EBV, RMSF and Lyme titers done by her PCP per patient.Parvovirus B19 antibody IgG was positive and IgM negative, Toxoplasma gondii antibody, IgM negative.  Patient had no recurrence of lymphadenopathy after resection.  Lab findings were discussed with patient at length.  Chronic pain of both knees-she complains of some discomfort over the medial aspect of both knees.  She had no warmth swelling effusion.  Have given her a handout on knee joint muscle strengthening exercises.  If she had persistent symptoms x-rays will be performed in the future.  Rash - facial erythema, the rash is all over her face and not typical for malar rash.  Patient states she had been evaluated by  dermatologist in the past and was diagnosed with rosacea.  When she uses topical agents the rash improves.  +ANA 1: 80 NH which is a nonspecific pattern and low titer.  Smith antibody and double-stranded DNA antibody were negative..  All other autoimmune work-up was negative.  I detailed discussion regarding the lab work with the patient.  I also discussed in case she develops any new symptoms then she should come in for evaluation.  Pancytopenia (HCC) - Neutropenia and thrombocytopenia resolved.  Most likely etiology could be postviral.  Eosinophilic esophagitis - Status post esophageal stretching 2005.  Patient states she was diagnosed with esophageal esophagitis in [place] and had stretching at age 36.  Periumbilical abdominal pain -she has been experiencing some periumbilical discomfort and bloating.  She was evaluated by Dr. Collene Mares.  She is scheduled to have CT scan of her abdomen.  History of malignant melanoma of skin - right side of the neck, 10/2005 resection. She is followed by Dr. Renda Rolls in [place].  History of psoriasis - On her scalp.  She uses topical agents.  Orders: No orders of the defined types were placed in this encounter.  No orders of the defined types were placed in this encounter.    Follow-Up Instructions: Return if symptoms worsen or fail to improve, for Histiocytic necrotizing lymphadenitis.   Bo Merino, MD  Note - This record has been created using Editor, commissioning.  Chart creation errors have been sought, but may not always  have been located. Such creation errors do not reflect on  the standard of medical care.

## 2020-06-06 NOTE — Telephone Encounter (Signed)
Patient advised per Dr. Estanislado Pandy she should ask her PCP. Patient expressed understanding.

## 2020-06-06 NOTE — Telephone Encounter (Signed)
Patient called stating she was tested for mononucleosis in December, but thinks she was tested too early and is requesting a return call to let her know if she needs to be retested.

## 2020-06-06 NOTE — Telephone Encounter (Signed)
Patient should ask her PCP.

## 2020-06-07 ENCOUNTER — Other Ambulatory Visit: Payer: Self-pay | Admitting: Gastroenterology

## 2020-06-07 DIAGNOSIS — R109 Unspecified abdominal pain: Secondary | ICD-10-CM

## 2020-06-08 ENCOUNTER — Encounter: Payer: Self-pay | Admitting: Rheumatology

## 2020-06-08 ENCOUNTER — Other Ambulatory Visit: Payer: Self-pay

## 2020-06-08 ENCOUNTER — Ambulatory Visit: Payer: BC Managed Care – PPO | Admitting: Rheumatology

## 2020-06-08 VITALS — BP 120/84 | HR 77 | Resp 13 | Ht 64.0 in | Wt 157.6 lb

## 2020-06-08 DIAGNOSIS — Z85820 Personal history of malignant melanoma of skin: Secondary | ICD-10-CM

## 2020-06-08 DIAGNOSIS — K200 Eosinophilic esophagitis: Secondary | ICD-10-CM

## 2020-06-08 DIAGNOSIS — D61818 Other pancytopenia: Secondary | ICD-10-CM

## 2020-06-08 DIAGNOSIS — I881 Chronic lymphadenitis, except mesenteric: Secondary | ICD-10-CM

## 2020-06-08 DIAGNOSIS — R1033 Periumbilical pain: Secondary | ICD-10-CM

## 2020-06-08 DIAGNOSIS — Z872 Personal history of diseases of the skin and subcutaneous tissue: Secondary | ICD-10-CM

## 2020-06-08 DIAGNOSIS — R590 Localized enlarged lymph nodes: Secondary | ICD-10-CM

## 2020-06-08 DIAGNOSIS — M25561 Pain in right knee: Secondary | ICD-10-CM | POA: Diagnosis not present

## 2020-06-08 DIAGNOSIS — R21 Rash and other nonspecific skin eruption: Secondary | ICD-10-CM | POA: Diagnosis not present

## 2020-06-08 DIAGNOSIS — M25562 Pain in left knee: Secondary | ICD-10-CM

## 2020-06-08 DIAGNOSIS — G8929 Other chronic pain: Secondary | ICD-10-CM

## 2020-06-08 LAB — URINALYSIS, ROUTINE W REFLEX MICROSCOPIC
Bacteria, UA: NONE SEEN /HPF
Bilirubin Urine: NEGATIVE
Glucose, UA: NEGATIVE
Hyaline Cast: NONE SEEN /LPF
Ketones, ur: NEGATIVE
Leukocytes,Ua: NEGATIVE
Nitrite: NEGATIVE
Protein, ur: NEGATIVE
Specific Gravity, Urine: 1.023 (ref 1.001–1.03)
WBC, UA: NONE SEEN /HPF (ref 0–5)
pH: <=5 (ref 5.0–8.0)

## 2020-06-08 LAB — SJOGREN'S SYNDROME ANTIBODS(SSA + SSB)
SSA (Ro) (ENA) Antibody, IgG: <1 AI
SSB (La) (ENA) Antibody, IgG: <1 AI

## 2020-06-08 LAB — COMPLETE METABOLIC PANEL WITH GFR
AG Ratio: 1.8 (calc) (ref 1.0–2.5)
ALT: 18 U/L (ref 6–29)
AST: 21 U/L (ref 10–30)
Albumin: 4.6 g/dL (ref 3.6–5.1)
Alkaline phosphatase (APISO): 61 U/L (ref 31–125)
BUN: 12 mg/dL (ref 7–25)
CO2: 29 mmol/L (ref 20–32)
Calcium: 9.5 mg/dL (ref 8.6–10.2)
Chloride: 104 mmol/L (ref 98–110)
Creat: 0.68 mg/dL (ref 0.50–1.10)
GFR, Est African American: 130 mL/min/{1.73_m2} (ref >=60)
GFR, Est Non African American: 112 mL/min/{1.73_m2} (ref >=60)
Globulin: 2.6 g/dL (calc) (ref 1.9–3.7)
Glucose, Bld: 84 mg/dL (ref 65–99)
Potassium: 4.7 mmol/L (ref 3.5–5.3)
Sodium: 140 mmol/L (ref 135–146)
Total Bilirubin: 0.6 mg/dL (ref 0.2–1.2)
Total Protein: 7.2 g/dL (ref 6.1–8.1)

## 2020-06-08 LAB — CBC WITH DIFFERENTIAL/PLATELET
Absolute Monocytes: 342 cells/uL (ref 200–950)
Basophils Absolute: 41 cells/uL (ref 0–200)
Basophils Relative: 0.9 %
Eosinophils Absolute: 495 cells/uL (ref 15–500)
Eosinophils Relative: 11 %
HCT: 44 % (ref 35.0–45.0)
Hemoglobin: 15.1 g/dL (ref 11.7–15.5)
Lymphs Abs: 995 cells/uL (ref 850–3900)
MCH: 32.3 pg (ref 27.0–33.0)
MCHC: 34.3 g/dL (ref 32.0–36.0)
MCV: 94.2 fL (ref 80.0–100.0)
MPV: 9.4 fL (ref 7.5–12.5)
Monocytes Relative: 7.6 %
Neutro Abs: 2628 cells/uL (ref 1500–7800)
Neutrophils Relative %: 58.4 %
Platelets: 239 10*3/uL (ref 140–400)
RBC: 4.67 10*6/uL (ref 3.80–5.10)
RDW: 12.1 % (ref 11.0–15.0)
Total Lymphocyte: 22.1 %
WBC: 4.5 10*3/uL (ref 3.8–10.8)

## 2020-06-08 LAB — TEST AUTHORIZATION

## 2020-06-08 LAB — TOXOPLASMA GONDII ANTIBODY, IGM: Toxoplasma Antibody- IgM: <8 AU/mL

## 2020-06-08 LAB — ANTI-NUCLEAR AB-TITER (ANA TITER)
ANA TITER: 1:80 {titer} — ABNORMAL HIGH
ANA Titer 1: 1:80 {titer} — ABNORMAL HIGH

## 2020-06-08 LAB — SEDIMENTATION RATE: Sed Rate: 6 mm/h (ref 0–20)

## 2020-06-08 LAB — ANTI-DNA ANTIBODY, DOUBLE-STRANDED: ds DNA Ab: <1 IU/mL

## 2020-06-08 LAB — RNP ANTIBODY: Ribonucleic Protein(ENA) Antibody, IgG: <1 AI

## 2020-06-08 LAB — CK: Total CK: 42 U/L (ref 29–143)

## 2020-06-08 LAB — C3 AND C4
C3 Complement: 144 mg/dL (ref 83–193)
C4 Complement: 22 mg/dL (ref 15–57)

## 2020-06-08 LAB — PARVOVIRUS B19 ANTIBODY, IGG AND IGM
Parvovirus B19 IgG: 7.9 — ABNORMAL HIGH (ref <0.9)
Parvovirus B19 IgM: 0 (ref <0.9)

## 2020-06-08 LAB — ANA: Anti Nuclear Antibody (ANA): POSITIVE — AB

## 2020-06-08 LAB — ANTI-SMITH ANTIBODY: ENA SM Ab Ser-aCnc: <1 AI

## 2020-06-08 LAB — TSH: TSH: 2.18 mIU/L

## 2020-06-08 LAB — ANTI-SCLERODERMA ANTIBODY: Scleroderma (Scl-70) (ENA) Antibody, IgG: <1 AI

## 2020-06-08 NOTE — Patient Instructions (Signed)
Journal for Nurse Practitioners, 15(4), 263-267. Retrieved January 27, 2018 from http://clinicalkey.com/nursing">  Knee Exercises Ask your health care provider which exercises are safe for you. Do exercises exactly as told by your health care provider and adjust them as directed. It is normal to feel mild stretching, pulling, tightness, or discomfort as you do these exercises. Stop right away if you feel sudden pain or your pain gets worse. Do not begin these exercises until told by your health care provider. Stretching and range-of-motion exercises These exercises warm up your muscles and joints and improve the movement and flexibility of your knee. These exercises also help to relieve pain and swelling. Knee extension, prone 1. Lie on your abdomen (prone position) on a bed. 2. Place your left / right knee just beyond the edge of the surface so your knee is not on the bed. You can put a towel under your left / right thigh just above your kneecap for comfort. 3. Relax your leg muscles and allow gravity to straighten your knee (extension). You should feel a stretch behind your left / right knee. 4. Hold this position for __________ seconds. 5. Scoot up so your knee is supported between repetitions. Repeat __________ times. Complete this exercise __________ times a day. Knee flexion, active 1. Lie on your back with both legs straight. If this causes back discomfort, bend your left / right knee so your foot is flat on the floor. 2. Slowly slide your left / right heel back toward your buttocks. Stop when you feel a gentle stretch in the front of your knee or thigh (flexion). 3. Hold this position for __________ seconds. 4. Slowly slide your left / right heel back to the starting position. Repeat __________ times. Complete this exercise __________ times a day.   Quadriceps stretch, prone 1. Lie on your abdomen on a firm surface, such as a bed or padded floor. 2. Bend your left / right knee and hold  your ankle. If you cannot reach your ankle or pant leg, loop a belt around your foot and grab the belt instead. 3. Gently pull your heel toward your buttocks. Your knee should not slide out to the side. You should feel a stretch in the front of your thigh and knee (quadriceps). 4. Hold this position for __________ seconds. Repeat __________ times. Complete this exercise __________ times a day.   Hamstring, supine 1. Lie on your back (supine position). 2. Loop a belt or towel over the ball of your left / right foot. The ball of your foot is on the walking surface, right under your toes. 3. Straighten your left / right knee and slowly pull on the belt to raise your leg until you feel a gentle stretch behind your knee (hamstring). ? Do not let your knee bend while you do this. ? Keep your other leg flat on the floor. 4. Hold this position for __________ seconds. Repeat __________ times. Complete this exercise __________ times a day. Strengthening exercises These exercises build strength and endurance in your knee. Endurance is the ability to use your muscles for a long time, even after they get tired. Quadriceps, isometric This exercise stretches the muscles in front of your thigh (quadriceps) without moving your knee joint (isometric). 1. Lie on your back with your left / right leg extended and your other knee bent. Put a rolled towel or small pillow under your knee if told by your health care provider. 2. Slowly tense the muscles in the front of your   left / right thigh. You should see your kneecap slide up toward your hip or see increased dimpling just above the knee. This motion will push the back of the knee toward the floor. 3. For __________ seconds, hold the muscle as tight as you can without increasing your pain. 4. Relax the muscles slowly and completely. Repeat __________ times. Complete this exercise __________ times a day.   Straight leg raises This exercise stretches the muscles in  front of your thigh (quadriceps) and the muscles that move your hips (hip flexors). 1. Lie on your back with your left / right leg extended and your other knee bent. 2. Tense the muscles in the front of your left / right thigh. You should see your kneecap slide up or see increased dimpling just above the knee. Your thigh may even shake a bit. 3. Keep these muscles tight as you raise your leg 4-6 inches (10-15 cm) off the floor. Do not let your knee bend. 4. Hold this position for __________ seconds. 5. Keep these muscles tense as you lower your leg. 6. Relax your muscles slowly and completely after each repetition. Repeat __________ times. Complete this exercise __________ times a day. Hamstring, isometric 1. Lie on your back on a firm surface. 2. Bend your left / right knee about __________ degrees. 3. Dig your left / right heel into the surface as if you are trying to pull it toward your buttocks. Tighten the muscles in the back of your thighs (hamstring) to "dig" as hard as you can without increasing any pain. 4. Hold this position for __________ seconds. 5. Release the tension gradually and allow your muscles to relax completely for __________ seconds after each repetition. Repeat __________ times. Complete this exercise __________ times a day. Hamstring curls If told by your health care provider, do this exercise while wearing ankle weights. Begin with __________ lb weights. Then increase the weight by 1 lb (0.5 kg) increments. Do not wear ankle weights that are more than __________ lb. 1. Lie on your abdomen with your legs straight. 2. Bend your left / right knee as far as you can without feeling pain. Keep your hips flat against the floor. 3. Hold this position for __________ seconds. 4. Slowly lower your leg to the starting position. Repeat __________ times. Complete this exercise __________ times a day.   Squats This exercise strengthens the muscles in front of your thigh and knee  (quadriceps). 1. Stand in front of a table, with your feet and knees pointing straight ahead. You may rest your hands on the table for balance but not for support. 2. Slowly bend your knees and lower your hips like you are going to sit in a chair. ? Keep your weight over your heels, not over your toes. ? Keep your lower legs upright so they are parallel with the table legs. ? Do not let your hips go lower than your knees. ? Do not bend lower than told by your health care provider. ? If your knee pain increases, do not bend as low. 3. Hold the squat position for __________ seconds. 4. Slowly push with your legs to return to standing. Do not use your hands to pull yourself to standing. Repeat __________ times. Complete this exercise __________ times a day. Wall slides This exercise strengthens the muscles in front of your thigh and knee (quadriceps). 1. Lean your back against a smooth wall or door, and walk your feet out 18-24 inches (46-61 cm) from it. 2.   Place your feet hip-width apart. 3. Slowly slide down the wall or door until your knees bend __________ degrees. Keep your knees over your heels, not over your toes. Keep your knees in line with your hips. 4. Hold this position for __________ seconds. Repeat __________ times. Complete this exercise __________ times a day.   Straight leg raises This exercise strengthens the muscles that rotate the leg at the hip and move it away from your body (hip abductors). 1. Lie on your side with your left / right leg in the top position. Lie so your head, shoulder, knee, and hip line up. You may bend your bottom knee to help you keep your balance. 2. Roll your hips slightly forward so your hips are stacked directly over each other and your left / right knee is facing forward. 3. Leading with your heel, lift your top leg 4-6 inches (10-15 cm). You should feel the muscles in your outer hip lifting. ? Do not let your foot drift forward. ? Do not let your  knee roll toward the ceiling. 4. Hold this position for __________ seconds. 5. Slowly return your leg to the starting position. 6. Let your muscles relax completely after each repetition. Repeat __________ times. Complete this exercise __________ times a day.   Straight leg raises This exercise stretches the muscles that move your hips away from the front of the pelvis (hip extensors). 1. Lie on your abdomen on a firm surface. You can put a pillow under your hips if that is more comfortable. 2. Tense the muscles in your buttocks and lift your left / right leg about 4-6 inches (10-15 cm). Keep your knee straight as you lift your leg. 3. Hold this position for __________ seconds. 4. Slowly lower your leg to the starting position. 5. Let your leg relax completely after each repetition. Repeat __________ times. Complete this exercise __________ times a day. This information is not intended to replace advice given to you by your health care provider. Make sure you discuss any questions you have with your health care provider. Document Revised: 01/28/2018 Document Reviewed: 01/28/2018 Elsevier Patient Education  2021 [employer]  

## 2020-06-08 NOTE — Progress Notes (Signed)
I will discuss results at the follow-up visit.

## 2020-06-10 ENCOUNTER — Other Ambulatory Visit: Payer: Self-pay | Admitting: Gastroenterology

## 2020-06-10 DIAGNOSIS — R1310 Dysphagia, unspecified: Secondary | ICD-10-CM

## 2020-06-15 ENCOUNTER — Ambulatory Visit
Admission: RE | Admit: 2020-06-15 | Discharge: 2020-06-15 | Disposition: A | Discharge status: Home / Self Care | Payer: BC Managed Care – PPO | Source: Ambulatory Visit | Attending: Gastroenterology | Admitting: Gastroenterology

## 2020-06-15 ENCOUNTER — Other Ambulatory Visit: Payer: Self-pay

## 2020-06-15 DIAGNOSIS — R1310 Dysphagia, unspecified: Secondary | ICD-10-CM

## 2020-06-20 ENCOUNTER — Ambulatory Visit
Admission: RE | Admit: 2020-06-20 | Discharge: 2020-06-20 | Disposition: A | Discharge status: Home / Self Care | Payer: BC Managed Care – PPO | Source: Ambulatory Visit | Attending: Gastroenterology | Admitting: Gastroenterology

## 2020-06-20 DIAGNOSIS — R109 Unspecified abdominal pain: Secondary | ICD-10-CM

## 2020-06-20 MED ORDER — IOPAMIDOL (ISOVUE-300) INJECTION 61%
100.0000 mL | Freq: Once | INTRAVENOUS | Status: AC | PRN
  Administered 2020-06-20: 100 mL via INTRAVENOUS

## 2020-06-30 ENCOUNTER — Ambulatory Visit: Payer: BC Managed Care – PPO | Admitting: Rheumatology

## 2020-09-25 ENCOUNTER — Other Ambulatory Visit: Payer: Self-pay

## 2020-09-25 ENCOUNTER — Encounter (HOSPITAL_COMMUNITY): Payer: Self-pay | Admitting: Emergency Medicine

## 2020-09-25 ENCOUNTER — Emergency Department (HOSPITAL_COMMUNITY)
Admission: EM | Admit: 2020-09-25 | Discharge: 2020-09-26 | Disposition: A | Discharge status: Home / Self Care | Payer: BC Managed Care – PPO | Attending: Emergency Medicine | Admitting: Emergency Medicine

## 2020-09-25 DIAGNOSIS — R1012 Left upper quadrant pain: Secondary | ICD-10-CM | POA: Diagnosis not present

## 2020-09-25 LAB — COMPREHENSIVE METABOLIC PANEL
ALT: 18 U/L (ref 0–44)
AST: 23 U/L (ref 15–41)
Albumin: 3.7 g/dL (ref 3.5–5.0)
Alkaline Phosphatase: 65 U/L (ref 38–126)
Anion gap: 9 (ref 5–15)
BUN: 9 mg/dL (ref 6–20)
CO2: 24 mmol/L (ref 22–32)
Calcium: 8.6 mg/dL — ABNORMAL LOW (ref 8.9–10.3)
Chloride: 103 mmol/L (ref 98–111)
Creatinine, Ser: 0.64 mg/dL (ref 0.44–1.00)
GFR, Estimated: >60 mL/min (ref >60)
Glucose, Bld: 100 mg/dL — ABNORMAL HIGH (ref 70–99)
Potassium: 3 mmol/L — ABNORMAL LOW (ref 3.5–5.1)
Sodium: 136 mmol/L (ref 135–145)
Total Bilirubin: 0.8 mg/dL (ref 0.3–1.2)
Total Protein: 6.4 g/dL — ABNORMAL LOW (ref 6.5–8.1)

## 2020-09-25 LAB — CBC WITH DIFFERENTIAL/PLATELET
Abs Immature Granulocytes: 0.01 10*3/uL (ref 0.00–0.07)
Basophils Absolute: 0.1 10*3/uL (ref 0.0–0.1)
Basophils Relative: 1 %
Eosinophils Absolute: 0.4 10*3/uL (ref 0.0–0.5)
Eosinophils Relative: 5 %
HCT: 40.4 % (ref 36.0–46.0)
Hemoglobin: 14.2 g/dL (ref 12.0–15.0)
Immature Granulocytes: 0 %
Lymphocytes Relative: 26 %
Lymphs Abs: 1.9 10*3/uL (ref 0.7–4.0)
MCH: 32.4 pg (ref 26.0–34.0)
MCHC: 35.1 g/dL (ref 30.0–36.0)
MCV: 92.2 fL (ref 80.0–100.0)
Monocytes Absolute: 0.6 10*3/uL (ref 0.1–1.0)
Monocytes Relative: 8 %
Neutro Abs: 4.4 10*3/uL (ref 1.7–7.7)
Neutrophils Relative %: 60 %
Platelets: 259 10*3/uL (ref 150–400)
RBC: 4.38 MIL/uL (ref 3.87–5.11)
RDW: 11.7 % (ref 11.5–15.5)
WBC: 7.4 10*3/uL (ref 4.0–10.5)
nRBC: 0 % (ref 0.0–0.2)

## 2020-09-25 LAB — URINALYSIS, ROUTINE W REFLEX MICROSCOPIC
Bilirubin Urine: NEGATIVE
Glucose, UA: NEGATIVE mg/dL
Hgb urine dipstick: NEGATIVE
Ketones, ur: NEGATIVE mg/dL
Leukocytes,Ua: NEGATIVE
Nitrite: NEGATIVE
Protein, ur: NEGATIVE mg/dL
Specific Gravity, Urine: 1.02 (ref 1.005–1.030)
pH: 7 (ref 5.0–8.0)

## 2020-09-25 LAB — LIPASE, BLOOD: Lipase: 33 U/L (ref 11–51)

## 2020-09-25 LAB — TROPONIN I (HIGH SENSITIVITY): Troponin I (High Sensitivity): 3 ng/L (ref <18)

## 2020-09-25 LAB — I-STAT BETA HCG BLOOD, ED (MC, WL, AP ONLY): I-stat hCG, quantitative: <5 m[IU]/mL (ref <5)

## 2020-09-25 NOTE — ED Provider Notes (Signed)
Emergency Medicine Provider Triage Evaluation Note  Lauren Fletcher , a 40 y.o. female  was evaluated in triage.  Pt complains of worsening of chronic abdominal pain. She reports that for 3 months she has had periumbilical pain.  She has been seen by GI had endoscopy, colonoscopy, and a CAT scan about 3 months ago when this started without cause for symptoms found.  She denies any GU symptoms including urinary symptoms. No nausea vomiting diarrhea. She does have a history of eosinophilic esophagitis. She states that for the past month she has had left upper quadrant abdominal pain.  She states that it hurts when she tries to take a full big deep breath. Her pain is usually about a 4 out of 10 however today is a 7 out of 10.  She denies any trauma.  No fevers, chest pain..  Review of Systems  Positive: Abdominal pain Negative: Fever, shortness of breath  Physical Exam  BP (!) 169/107 (BP Location: Right Arm)   Pulse 88   Temp 99.2 F (37.3 C)   Resp 16   SpO2 100%  Gen:   Awake, no distress   Resp:  Normal effort  MSK:   Moves extremities without difficulty  Other:  Abdomen is tender in the epigastrium and left upper quadrant.  No tenderness to palpation over the left-sided lower rib/chest.  Medical Decision Making  Medically screening exam initiated at 6:46 PM.  Appropriate orders placed.  Lauren Fletcher was informed that the remainder of the evaluation will be completed by another provider, this initial triage assessment does not replace that evaluation, and the importance of remaining in the ED until their evaluation is complete.     Lorin Glass, PA-C 09/25/20 1847    Valarie Merino, MD 09/25/20 [phone]

## 2020-09-25 NOTE — ED Triage Notes (Signed)
Pt reports periumbilical pain x 3 months.  She has been seen by GI.  Reports LUQ pain x 1 month worse with deep inspiration.  Denies nausea and vomiting.

## 2020-09-26 ENCOUNTER — Emergency Department (HOSPITAL_COMMUNITY): Payer: BC Managed Care – PPO

## 2020-09-26 LAB — TROPONIN I (HIGH SENSITIVITY): Troponin I (High Sensitivity): 2 ng/L (ref <18)

## 2020-09-26 MED ORDER — IOHEXOL 300 MG/ML  SOLN
75.0000 mL | Freq: Once | INTRAMUSCULAR | Status: AC | PRN
  Administered 2020-09-26: 75 mL via INTRAVENOUS

## 2020-09-26 MED ORDER — HYOSCYAMINE SULFATE SL 0.125 MG SL SUBL: 0.1250 mg | SUBLINGUAL_TABLET | Freq: Four times a day (QID) | SUBLINGUAL | 0 refills | Status: DC | PRN

## 2020-09-26 NOTE — ED Provider Notes (Signed)
[facility] EMERGENCY DEPARTMENT Provider Note   CSN: [id number] Arrival date & time: 09/25/20  1828     History Chief Complaint  Patient presents with  . Abdominal Pain    Lauren Fletcher is a 40 y.o. female.  Patient presents to the emergency department for evaluation of abdominal pain.  Patient has been experiencing abdominal pain for several months.  She was seen by her GI doctor for this already.  Patient reports, however, in the last few weeks the pain has migrated from the central portion of the abdomen to the left upper abdomen.  No associated fever, nausea, vomiting or diarrhea.        Past Medical History:  Diagnosis Date  . Eosinophilic esophagitis   . Melanoma ([place])   . Seasonal allergies     Patient Active Problem List   Diagnosis Date Noted  . 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 History of malignant melanoma of skin 06/08/2020  . History of psoriasis 06/08/2020  . Eosinophilic esophagitis 37/85/8850    Past Surgical History:  Procedure Laterality Date  . LYMPH NODE BIOPSY Right 04/29/2020   Procedure: Right Neck Lymph Node Excisional Biopsy;  Surgeon: [name], MD;  Location: [facility];  Service: ENT;  Laterality: Right;  . MELANOMA EXCISION     Laser surgery     OB History   No obstetric history on file.     Family History  Problem Relation Age of Onset  . Healthy Mother   . Healthy Father   . Healthy Brother     Social History   Tobacco Use  . Smoking status: Never Smoker  . Smokeless tobacco: Never Used  Vaping Use  . Vaping Use: Never used  Substance Use Topics  . Alcohol use: Yes    Comment: occ  . Drug use: Never    Home Medications Prior to Admission medications   Medication Sig Start Date End Date Taking? Authorizing Provider  albuterol (PROVENTIL HFA;VENTOLIN HFA) 108 (90 Base) MCG/ACT inhaler Inhale 2 puffs into the lungs every 6 (six) hours as needed. 07/29/18   Kennith Gain, MD  guaiFENesin (MUCINEX) 600 MG 12 hr tablet Take 600 mg by mouth 2 (two) times daily as needed for to loosen phlegm.    [provider]  hydrocortisone valerate cream (WESTCORT) 0.2 % Apply topically as needed. 05/17/20   [provider]  ibuprofen (ADVIL) 200 MG tablet Take 400-600 mg by mouth every 6 (six) hours as needed (inflammation).    [provider]  ketoconazole (NIZORAL) 2 % shampoo SMARTSIG:Topical 2-3 Times Weekly 05/16/20   [provider]  Spacer/Aero-Holding Chambers (E-Z SPACER) inhaler Use as instructed 07/29/18   Kennith Gain, MD    Allergies    Amoxicillin, Bean pod extract, Milk-related compounds, and Penicillins  Review of Systems   Review of Systems  Gastrointestinal: Positive for abdominal pain.  All other systems reviewed and are negative.   Physical Exam Updated Vital Signs BP (!) 117/96   Pulse 63   Temp 98.7 F (37.1 C) (Oral)   Resp 18   LMP 09/21/2020   SpO2 98%   Physical Exam Vitals and nursing note reviewed.  Constitutional:      General: She is not in acute distress.    Appearance: Normal appearance. She is well-developed.  HENT:     Head: Normocephalic and atraumatic.     Right Ear: Hearing normal.     Left Ear: Hearing normal.  Nose: Nose normal.  Eyes:     Conjunctiva/sclera: Conjunctivae normal.     Pupils: Pupils are equal, round, and reactive to light.  Cardiovascular:     Rate and Rhythm: Regular rhythm.     Heart sounds: S1 normal and S2 normal. No murmur heard. No friction rub. No gallop.   Pulmonary:     Effort: Pulmonary effort is normal. No respiratory distress.     Breath sounds: Normal breath sounds.  Chest:     Chest wall: No tenderness.  Abdominal:     General: Bowel sounds are normal.     Palpations: Abdomen is soft.     Tenderness: There is abdominal tenderness in the left upper quadrant. There is no guarding or rebound. Negative signs include Murphy's  sign and McBurney's sign.     Hernia: No hernia is present.  Musculoskeletal:        General: Normal range of motion.     Cervical back: Normal range of motion and neck supple.  Skin:    General: Skin is warm and dry.     Findings: No rash.  Neurological:     Mental Status: She is alert and oriented to person, place, and time.     GCS: GCS eye subscore is 4. GCS verbal subscore is 5. GCS motor subscore is 6.     Cranial Nerves: No cranial nerve deficit.     Sensory: No sensory deficit.     Coordination: Coordination normal.  Psychiatric:        Speech: Speech normal.        Behavior: Behavior normal.        Thought Content: Thought content normal.     ED Results / Procedures / Treatments   Labs (all labs ordered are listed, but only abnormal results are displayed) Labs Reviewed  COMPREHENSIVE METABOLIC PANEL - Abnormal; Notable for the following components:      Result Value   Potassium 3.0 (*)    Glucose, Bld 100 (*)    Calcium 8.6 (*)    Total Protein 6.4 (*)    All other components within normal limits  URINALYSIS, ROUTINE W REFLEX MICROSCOPIC - Abnormal; Notable for the following components:   Bacteria, UA RARE (*)    All other components within normal limits  CBC WITH DIFFERENTIAL/PLATELET  LIPASE, BLOOD  I-STAT BETA HCG BLOOD, ED ([facility], [facility], [facility] ONLY)  TROPONIN I (HIGH SENSITIVITY)  TROPONIN I (HIGH SENSITIVITY)    EKG EKG Interpretation  Date/Time:  Sunday September 25 2020 20:10:04 EDT Ventricular Rate:  75 PR Interval:  146 QRS Duration: 86 QT Interval:  398 QTC Calculation: 444 R Axis:   73 Text Interpretation: Normal sinus rhythm Normal ECG Confirmed by Orpah Greek [phone]) on 09/26/2020 1:25:45 AM   Radiology CT ABDOMEN PELVIS W CONTRAST  Result Date: 09/26/2020 CLINICAL DATA:  Nonlocalized acute abdominal pain. Pt reports periumbilical pain x 3 months. She has been seen by GI. Reports LUQ pain x 1 month worse with deep inspiration. Denies nausea  and vomiting. 4 days post colonoscopy, tonight right prominent rib pain EXAM: CT ABDOMEN AND PELVIS WITH CONTRAST TECHNIQUE: Multidetector CT imaging of the abdomen and pelvis was performed using the standard protocol following bolus administration of intravenous contrast. CONTRAST:  65mL OMNIPAQUE IOHEXOL 300 MG/ML  SOLN COMPARISON:  CT abdomen pelvis 06/20/2020 FINDINGS: Lower chest: No acute abnormality. Hepatobiliary: No focal liver abnormality. No gallstones, gallbladder wall thickening, or pericholecystic fluid. No biliary dilatation. Pancreas: No focal  lesion. Normal pancreatic contour. No surrounding inflammatory changes. No main pancreatic ductal dilatation. Spleen: Normal in size without focal abnormality. Adrenals/Urinary Tract: No adrenal nodule bilaterally. Bilateral kidneys enhance symmetrically. Slightly malrotated left kidney. No hydronephrosis. No hydroureter. The urinary bladder is decompressed and grossly unremarkable. Stomach/Bowel: Stomach is within normal limits. No evidence of bowel wall thickening or dilatation. Appendix appears normal. Vascular/Lymphatic: There are two left renal arteries with the lower left renal artery that terminates at the left common iliac artery. There are three left renal veins two of which are retroaortic. No significant vascular findings are present. No enlarged abdominal or pelvic lymph nodes. Reproductive: Retroverted uterus. Uterus and bilateral adnexa are unremarkable. Other: No intraperitoneal free fluid. No intraperitoneal free gas. No organized fluid collection. Musculoskeletal: No abdominal wall hernia or abnormality. No suspicious lytic or blastic osseous lesions. No acute displaced fracture. IMPRESSION: 1. No acute intra-abdominal or intrapelvic abnormality. 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 Electronically Signed    By: Iven Finn M.D.   On: 09/26/2020 05:30    Procedures Procedures   Medications Ordered in ED Medications  iohexol (OMNIPAQUE) 300 MG/ML solution 75 mL (75 mLs Intravenous Contrast Given 09/26/20 0449)    ED Course  I have reviewed the triage vital signs and the nursing notes.  Pertinent labs & imaging results that were available during my care of the patient were reviewed by me and considered in my medical decision making (see chart for details).    MDM Rules/Calculators/A&P                          Patient presents to the emergency department for evaluation of abdominal pain.  Patient has been experiencing abdominal pain for 3 months.  When her symptoms began her gastroenterologist performed upper and lower endoscopy and CAT scan.  She has a history of eosinophilic esophagitis, but no cause of her pain was identified with the work-up.  In the last few weeks, pain has migrated to the left upper quadrant.  She has mild left upper quadrant tenderness but no guarding or rebound.  Lab work unremarkable.  Repeat CT does not show any acute pathology.  Final Clinical Impression(s) / ED Diagnoses Final diagnoses:  Left upper quadrant abdominal pain    Rx / DC Orders ED Discharge Orders    None       Orpah Greek, MD 09/26/20 [phone]

## 2020-11-03 ENCOUNTER — Other Ambulatory Visit: Payer: Self-pay | Admitting: Otolaryngology

## 2020-11-03 DIAGNOSIS — R221 Localized swelling, mass and lump, neck: Secondary | ICD-10-CM

## 2020-11-11 ENCOUNTER — Ambulatory Visit
Admission: RE | Admit: 2020-11-11 | Discharge: 2020-11-11 | Disposition: A | Discharge status: Home / Self Care | Payer: BC Managed Care – PPO | Source: Ambulatory Visit | Attending: Otolaryngology | Admitting: Otolaryngology

## 2020-11-11 DIAGNOSIS — R221 Localized swelling, mass and lump, neck: Secondary | ICD-10-CM

## 2020-11-11 MED ORDER — IOPAMIDOL (ISOVUE-300) INJECTION 61%
75.0000 mL | Freq: Once | INTRAVENOUS | Status: AC | PRN
  Administered 2020-11-11: 75 mL via INTRAVENOUS

## 2020-12-02 ENCOUNTER — Other Ambulatory Visit: Payer: Self-pay | Admitting: Internal Medicine

## 2020-12-02 DIAGNOSIS — R599 Enlarged lymph nodes, unspecified: Secondary | ICD-10-CM

## 2020-12-10 ENCOUNTER — Other Ambulatory Visit: Payer: Self-pay

## 2020-12-10 ENCOUNTER — Other Ambulatory Visit: Payer: BC Managed Care – PPO

## 2020-12-10 ENCOUNTER — Other Ambulatory Visit: Payer: Self-pay | Admitting: Internal Medicine

## 2020-12-10 ENCOUNTER — Ambulatory Visit
Admission: RE | Admit: 2020-12-10 | Discharge: 2020-12-10 | Disposition: A | Discharge status: Home / Self Care | Payer: BC Managed Care – PPO | Source: Ambulatory Visit | Attending: Internal Medicine | Admitting: Internal Medicine

## 2020-12-10 DIAGNOSIS — R599 Enlarged lymph nodes, unspecified: Secondary | ICD-10-CM

## 2020-12-27 ENCOUNTER — Other Ambulatory Visit: Payer: Self-pay | Admitting: Internal Medicine

## 2020-12-27 DIAGNOSIS — R599 Enlarged lymph nodes, unspecified: Secondary | ICD-10-CM

## 2020-12-29 ENCOUNTER — Ambulatory Visit
Admission: RE | Admit: 2020-12-29 | Discharge: 2020-12-29 | Disposition: A | Discharge status: Home / Self Care | Payer: BC Managed Care – PPO | Source: Ambulatory Visit | Attending: Internal Medicine | Admitting: Internal Medicine

## 2020-12-29 DIAGNOSIS — R599 Enlarged lymph nodes, unspecified: Secondary | ICD-10-CM

## 2021-01-19 ENCOUNTER — Encounter: Payer: Self-pay | Admitting: *Deleted

## 2021-01-24 ENCOUNTER — Other Ambulatory Visit: Payer: Self-pay

## 2021-01-24 ENCOUNTER — Encounter: Payer: Self-pay | Admitting: Psychiatry

## 2021-01-24 ENCOUNTER — Ambulatory Visit (INDEPENDENT_AMBULATORY_CARE_PROVIDER_SITE_OTHER): Payer: BC Managed Care – PPO | Admitting: Psychiatry

## 2021-01-24 VITALS — BP 156/92 | HR 76 | Ht 64.0 in | Wt 160.0 lb

## 2021-01-24 DIAGNOSIS — R200 Anesthesia of skin: Secondary | ICD-10-CM

## 2021-01-24 NOTE — Progress Notes (Signed)
[facility]  PATIENT: Lauren Fletcher DOB: March 18, 1981  REFERRING CLINICIAN: Leta Baptist, MD HISTORY FROM: self REASON FOR VISIT: chin numbness   HISTORICAL  CHIEF COMPLAINT:  Chief Complaint  Patient presents with   Numbness    Rm 1 alone  Pt is well, has been experiencing chin numbness for about 3 months that has recently moved to her cheek. She is also experiencing some other symptoms such as swollen lymph nodes of neck, fatigue, fever, belly pain and occular headache. (States these weird things started after covid booster)    HISTORY OF PRESENT ILLNESS:   The patient presents for evaluation of left sided chin numbness which began in June 2022.  In December 2021 she developed right cervical lymphadenopathy. Biopsy showed necrotizing histiocytic lymphadenitis and no monoclonal cell lines were noted on flow cytometry. 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 Rheumatologic workup was sent and ANA was mildly elevated (1:80), but no clear rheumatologic disorder was found.   She then developed a constant pulling pain in her abdomen. Saw GI who did an endoscopy and colonoscopy as well as CT abdomen which were all normal.  Began to feel swelling of the lymph nodes in her neck again in the summer 2022. Then in June she developed numbness in her left chin. Saw ENT who ordered CT soft tissue of the neck 11/11/20 which showed resolution of lymphadenopathy. [place] of soft tissue in the neck 4 weeks ago was unremarkable. Was prescribed lorazepam which helped with her abdominal pain but not the numbness in her chin. Went to an Chief Financial Officer who noted decreased density of her jaw bone on the left, however this had been present on imaging prior to her symptoms.   In the last month the numbness has spread from her left chin to her cheek. It feels "like novacaine after the dentist". There is no numbness inside her mouth and she does not endorse pain or  paresthesias. 2 weeks ago she had wavy lines for in her vision which lasted for ~2 hours. 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 She does not have prior history of migraines.  Denies fevers or weight loss. Will sometimes get hot at night but no severe night sweats.  OTHER MEDICAL CONDITIONS: right neck melanoma    REVIEW OF SYSTEMS: Full 14 system review of systems performed and negative with exception of: numbess  ALLERGIES: Allergies  Allergen Reactions   Amoxicillin Hives   Bean Pod Extract Other (See Comments)    Esophageal Allergy   Milk-Related Compounds     Esophageal Allergy   Penicillins Hives and Rash    Reaction: 10 or more years    HOME MEDICATIONS: Outpatient Medications Prior to Visit  Medication Sig Dispense Refill   albuterol (PROVENTIL HFA;VENTOLIN HFA) 108 (90 Base) MCG/ACT inhaler Inhale 2 puffs into the lungs every 6 (six) hours as needed. 1 Inhaler 1   fluticasone (FLONASE) 50 MCG/ACT nasal spray Place into both nostrils as needed for allergies or rhinitis.     guaiFENesin (MUCINEX) 600 MG 12 hr tablet Take 600 mg by mouth 2 (two) times daily as needed for to loosen phlegm.     hydrocortisone valerate cream (WESTCORT) 0.2 % Apply topically as needed.     Hyoscyamine Sulfate SL (LEVSIN/SL) 0.125 MG SUBL Place 0.125-0.25 mg under the tongue every 6 (six) hours as needed (abd pain). 120 tablet 0   ibuprofen (ADVIL) 200 MG tablet Take 400-600 mg by mouth every 6 (  six) hours as needed (inflammation).     ketoconazole (NIZORAL) 2 % shampoo SMARTSIG:Topical 2-3 Times Weekly     levocetirizine (XYZAL) 5 MG tablet SMARTSIG:1 Tablet(s) By Mouth Every Evening     LORazepam (ATIVAN) 1 MG tablet Take 1 mg by mouth 2 (two) times daily as needed.     Spacer/Aero-Holding Chambers (E-Z SPACER) inhaler Use as instructed 1 each 2   No facility-administered medications prior to visit.    PAST MEDICAL HISTORY: Past Medical History:  Diagnosis Date   Eosinophilic  esophagitis    Melanoma ([place])    Seasonal allergies     PAST SURGICAL HISTORY: Past Surgical History:  Procedure Laterality Date   LYMPH NODE BIOPSY Right 04/29/2020   Procedure: Right Neck Lymph Node Excisional Biopsy;  Surgeon: [name], MD;  Location: [facility] OR;  Service: ENT;  Laterality: Right;   MELANOMA EXCISION     Laser surgery    FAMILY HISTORY: Family History  Problem Relation Age of Onset   Healthy Mother    Healthy Father    Healthy Brother   Hx colorectal in grandmother and lung cancer in paternal grandfather  SOCIAL HISTORY: Social History   Socioeconomic History   Marital status: Married    Spouse name: Not on file   Number of children: Not on file   Years of education: Not on file   Highest education level: Not on file  Occupational History    Comment: self employed  Tobacco Use   Smoking status: Never   Smokeless tobacco: Never  Vaping Use   Vaping Use: Never used  Substance and Sexual Activity   Alcohol use: Yes    Comment: occ   Drug use: Never   Sexual activity: Not on file  Other Topics Concern   Not on file  Social History Narrative   Lives with husband   Social Determinants of Health   Financial Resource Strain: Not on file  Food Insecurity: Not on file  Transportation Needs: Not on file  Physical Activity: Not on file  Stress: Not on file  Social Connections: Not on file  Intimate Partner Violence: Not on file     PHYSICAL EXAM  GENERAL EXAM/CONSTITUTIONAL: Vitals:  Vitals:   01/24/21 1516  BP: (!) 156/92  Pulse: 76  Weight: 160 lb (72.6 kg)  Height: 5' 4"  (1.626 m)   Body mass index is 27.46 kg/m. Wt Readings from Last 3 Encounters:  01/24/21 160 lb (72.6 kg)  06/08/20 157 lb 9.6 oz (71.5 kg)  06/02/20 156 lb 12.8 oz (71.1 kg)   Patient tearful, anxious; well developed, nourished and groomed; neck is supple  CARDIOVASCULAR: Examination of peripheral vascular system by observation and palpation is  normal  EYES: Pupils round and reactive to light, Visual fields full to confrontation, Extraocular movements intacts,   MUSCULOSKELETAL: Gait, strength, tone, movements noted in Neurologic exam below  NEUROLOGIC: awake, alert, oriented to person, place and time recent and remote memory intact normal attention and concentration language fluent, comprehension intact, naming intact fund of knowledge appropriate  CRANIAL NERVE:  2nd - no papilledema or hemorrhages on fundoscopic exam 2nd, 3rd, 4th, 6th - pupils equal and reactive to light, visual fields full to confrontation, extraocular muscles intact, no nystagmus 5th - diminished sensation to light touch over left V2, V3, diminished sensation to pin prick over left V3 7th - facial strength symmetric 8th - hearing intact 9th - palate elevates symmetrically, uvula midline 11th - shoulder shrug symmetric 12th -  tongue protrusion midline  MOTOR:  normal bulk and tone, full strength in the BUE, BLE  SENSORY:  normal and symmetric to light touch all 4 extremities  COORDINATION:  finger-nose-finger, fine finger movements normal  REFLEXES:  deep tendon reflexes present and symmetric  GAIT/STATION:  normal     DIAGNOSTIC DATA (LABS, IMAGING, TESTING) - I reviewed patient records, labs, notes, testing and imaging myself where available.  Lab Results  Component Value Date   WBC 7.4 09/25/2020   HGB 14.2 09/25/2020   HCT 40.4 09/25/2020   MCV 92.2 09/25/2020   PLT 259 09/25/2020      Component Value Date/Time   NA 136 09/25/2020 1846   K 3.0 (L) 09/25/2020 1846   CL 103 09/25/2020 1846   CO2 24 09/25/2020 1846   GLUCOSE 100 (H) 09/25/2020 1846   BUN 9 09/25/2020 1846   CREATININE 0.64 09/25/2020 1846   CREATININE 0.68 06/02/2020 0932   CALCIUM 8.6 (L) 09/25/2020 1846   PROT 6.4 (L) 09/25/2020 1846   ALBUMIN 3.7 09/25/2020 1846   AST 23 09/25/2020 1846   ALT 18 09/25/2020 1846   ALKPHOS 65 09/25/2020 1846    BILITOT 0.8 09/25/2020 1846   GFRNONAA >60 09/25/2020 1846   GFRNONAA 112 06/02/2020 0932   GFRAA 130 06/02/2020 0932   No results found for: CHOL, HDL, LDLCALC, LDLDIRECT, TRIG, CHOLHDL No results found for: HGBA1C No results found for: VITAMINB12 Lab Results  Component Value Date   TSH 2.18 06/02/2020   06/02/20 SSA, SSB, ENA, RNP, C3, C4, anti-smith, anti-DNA, ESR wnl  ANA 1:80   ASSESSMENT AND PLAN  40 y.o. year old female with a history of melanoma who presents for evaluation of numbness in her left chin and cheek which has been present over the past 4 months. Exam significant for diminished sensation to light touch and pin prick over left chin. Will order MRI brain with contrast to rule out an underlying structural cause for chin numbness including infectious, inflammatory, or neoplastic disorders. Patient has significant anxiety regarding the possible differential diagnoses of numb chin syndrome. Discussed how anxiety can worsen neurologic symptoms, and offered referral to psychology to help cope with her anxiety while awaiting workup. She will let me know if she chooses to pursue this.   1. Numbness       PLAN: -Blood work: Ace, IFE -MRI brain with contrast  Orders Placed This Encounter  Procedures   MR BRAIN W WO CONTRAST   Angiotensin converting enzyme   Immunofixation Electrophoresis, Serum     No orders of the defined types were placed in this encounter.   Return in about 3 months (around 04/26/2021).    Genia Harold, MD 01/24/21 4:17 PM  [facility] [address] [place], [place] [address] [phone]

## 2021-01-24 NOTE — Patient Instructions (Addendum)
Blood work MRI brain with contrast

## 2021-01-25 ENCOUNTER — Telehealth: Payer: Self-pay | Admitting: Psychiatry

## 2021-01-25 NOTE — Telephone Encounter (Signed)
MR Brain w/wo contrast Dr. Beckey Rutter Josem Kaufmann: [phone] (exp. 01/25/21 to 02/23/21). Patient is scheduled at [facility] for 02/01/21.

## 2021-01-26 LAB — IMMUNOFIXATION ELECTROPHORESIS
IgA/Immunoglobulin A, Serum: 215 mg/dL (ref 87–352)
IgG (Immunoglobin G), Serum: 1134 mg/dL (ref 586–1602)
IgM (Immunoglobulin M), Srm: 76 mg/dL (ref 26–217)
Total Protein: 7.2 g/dL (ref 6.0–8.5)

## 2021-01-26 LAB — ANGIOTENSIN CONVERTING ENZYME: Angio Convert Enzyme: 35 U/L (ref 14–82)

## 2021-02-01 ENCOUNTER — Ambulatory Visit (INDEPENDENT_AMBULATORY_CARE_PROVIDER_SITE_OTHER): Payer: BC Managed Care – PPO

## 2021-02-01 DIAGNOSIS — R200 Anesthesia of skin: Secondary | ICD-10-CM | POA: Diagnosis not present

## 2021-02-01 MED ORDER — GADOBENATE DIMEGLUMINE 529 MG/ML IV SOLN
15.0000 mL | Freq: Once | INTRAVENOUS | Status: AC | PRN
  Administered 2021-02-01: 15 mL via INTRAVENOUS

## 2021-02-02 ENCOUNTER — Other Ambulatory Visit: Payer: Self-pay | Admitting: Psychiatry

## 2021-02-02 DIAGNOSIS — M26622 Arthralgia of left temporomandibular joint: Secondary | ICD-10-CM

## 2021-02-06 ENCOUNTER — Other Ambulatory Visit: Payer: Self-pay | Admitting: Psychiatry

## 2021-02-06 ENCOUNTER — Telehealth: Payer: Self-pay | Admitting: Psychiatry

## 2021-02-06 DIAGNOSIS — R200 Anesthesia of skin: Secondary | ICD-10-CM

## 2021-02-06 NOTE — Telephone Encounter (Signed)
Can you give me some reason on why you want to order a MR Jaw?

## 2021-02-06 NOTE — Telephone Encounter (Signed)
Lauren Fletcher with [facility] is wanting to know is it TMJ W/O OR W/WO?

## 2021-02-06 NOTE — Telephone Encounter (Signed)
It is part of the evaluation for numb chin syndrome. It's to look for cancer infiltration of the area around the nerve that supplies sensation to the chin. I included a study link below if that's helpful:  A pitfall of brain MRI in evaluation of numb chin syndrome: mandibular MRI should be included to localize lesions.  [email or web address]

## 2021-02-06 NOTE — Telephone Encounter (Signed)
[employer] Josem Kaufmann: [phone] (exp. 02/06/21 to 03/07/21) order faxed to [facility] bc GI is not in network. they will reach out to the patient to schedule

## 2021-02-06 NOTE — Telephone Encounter (Signed)
There is only the one order option that I used for MRI mandible, but I updated it to say with contrast in the order comments. Let me know if that's alright or if there's a different way it should be ordered. Thanks!

## 2021-02-06 NOTE — Telephone Encounter (Signed)
W/WO, thanks

## 2021-02-06 NOTE — Telephone Encounter (Signed)
Okay perfect can you put a new order in for the w/wo contrast . Thank you!

## 2021-02-07 NOTE — Telephone Encounter (Signed)
Patient is scheduled for 10/26 1030am  At [facility].

## 2021-02-08 ENCOUNTER — Other Ambulatory Visit: Payer: Self-pay | Admitting: Psychiatry

## 2021-02-08 DIAGNOSIS — R202 Paresthesia of skin: Secondary | ICD-10-CM

## 2021-02-08 DIAGNOSIS — R200 Anesthesia of skin: Secondary | ICD-10-CM

## 2021-02-08 NOTE — Telephone Encounter (Signed)
I put in the order for MRI face, thanks

## 2021-02-08 NOTE — Telephone Encounter (Signed)
Per Larene Beach at [facility].  "Per Radiologist we need this order changed to mri face w/wo contrast. The tmj study will not get all of what is needed for this patients history. "

## 2021-02-09 NOTE — Telephone Encounter (Signed)
Thank you, faxed updated order to [facility].  Lorella Nimrod Josem Kaufmann: [phone] (exp. 02/08/21 to 03/09/21)

## 2021-03-08 ENCOUNTER — Other Ambulatory Visit: Payer: Self-pay | Admitting: Psychiatry

## 2021-03-08 DIAGNOSIS — R202 Paresthesia of skin: Secondary | ICD-10-CM

## 2021-03-08 DIAGNOSIS — R200 Anesthesia of skin: Secondary | ICD-10-CM

## 2021-03-20 NOTE — Telephone Encounter (Signed)
PET scan has been approved. PA #[id number] (03/20/21- 04/18/21). Order has been sent to centralized scheduling. They will call patient to schedule. Phone: [phone].

## 2021-03-29 ENCOUNTER — Ambulatory Visit (HOSPITAL_COMMUNITY)
Admission: RE | Admit: 2021-03-29 | Discharge: 2021-03-29 | Disposition: A | Discharge status: Home / Self Care | Payer: BC Managed Care – PPO | Source: Ambulatory Visit | Attending: Psychiatry | Admitting: Psychiatry

## 2021-03-29 DIAGNOSIS — R202 Paresthesia of skin: Secondary | ICD-10-CM | POA: Insufficient documentation

## 2021-03-29 DIAGNOSIS — R200 Anesthesia of skin: Secondary | ICD-10-CM | POA: Insufficient documentation

## 2021-03-29 LAB — GLUCOSE, CAPILLARY: Glucose-Capillary: 87 mg/dL (ref 70–99)

## 2021-03-29 MED ORDER — FLUDEOXYGLUCOSE F - 18 (FDG) INJECTION
8.0000 | Freq: Once | INTRAVENOUS | Status: AC
  Administered 2021-03-29: 7.9 via INTRAVENOUS

## 2021-05-03 ENCOUNTER — Ambulatory Visit: Payer: BC Managed Care – PPO | Admitting: Psychiatry

## 2021-05-23 NOTE — Progress Notes (Signed)
° °  CC:  numb chin  Follow-up Visit  Last visit: 01/24/21  Brief HPI: 41 year old female with a history of melanoma (neck) who follows in clinic for chin numbness on the left.  At her last visit MRI brain was ordered.  Interval History: Since her last visit she has continued to have numbness in her left chin. Her anxiety has decreased as her tests have come back normal. She has noticed symptoms worsen when she eats certain foods and is trying to avoid these.  She is now also reporting new excessive thirst and paresthesias in her right index finger.  MRI brain and jaw were unremarkable. PET scan showed no evidence of neoplasm.  Physical Exam:   Vital Signs: BP 128/82    Pulse 70    Ht 5\' 4"  (1.626 m)    Wt 166 lb (75.3 kg)    BMI 28.49 kg/m  GENERAL:  well appearing, in no acute distress, alert  SKIN:  Color, texture, turgor normal. No rashes or lesions HEAD:  Normocephalic/atraumatic. RESP: normal respiratory effort MSK:  No gross joint deformities.   NEUROLOGICAL: Mental Status: Alert, oriented to person, place and time, Follows commands, and Speech fluent and appropriate. Cranial Nerves: PERRL, face symmetric, diminished sensation left V2, no dysarthria, hearing grossly intact Motor: moves all extremities equally Gait: normal-based.  IMPRESSION: 41 year old female with a history of melanoma who presents for follow up of numb chin. Testing including MRI and PET scan were negative for neoplasm. She does endorse new paresthesias in her right finger and excessive thirst. Will check A1c today. Discussed that if testing is negative she can try a wrist splint at night to see if this helps her symptoms.  PLAN: -Blood work - A1c, CBC -Wrist splint at night -next steps: consider EMG if no improvement with wrist splint  Follow-up: as needed if symptoms worsen   I spent a total of 20 minutes on the date of the service. Discussed medication side effects, adverse reactions and drug  interactions. Written educational materials and patient instructions outlining all of the above were given.  Genia Harold, MD 05/24/21 9:18 AM

## 2021-05-24 ENCOUNTER — Encounter: Payer: Self-pay | Admitting: Psychiatry

## 2021-05-24 ENCOUNTER — Ambulatory Visit: Payer: BC Managed Care – PPO | Admitting: Psychiatry

## 2021-05-24 VITALS — BP 128/82 | HR 70 | Ht 64.0 in | Wt 166.0 lb

## 2021-05-24 DIAGNOSIS — R202 Paresthesia of skin: Secondary | ICD-10-CM | POA: Diagnosis not present

## 2021-05-24 NOTE — Patient Instructions (Addendum)
Blood test today - A1c, CBC Try wrist splint at night to help with tingling in right finger

## 2021-05-25 LAB — CBC WITH DIFFERENTIAL/PLATELET
Basophils Absolute: 0 10*3/uL (ref 0.0–0.2)
Basos: 1 %
EOS (ABSOLUTE): 0.4 10*3/uL (ref 0.0–0.4)
Eos: 8 %
Hematocrit: 42.8 % (ref 34.0–46.6)
Hemoglobin: 14.6 g/dL (ref 11.1–15.9)
Immature Grans (Abs): 0 10*3/uL (ref 0.0–0.1)
Immature Granulocytes: 0 %
Lymphocytes Absolute: 1.1 10*3/uL (ref 0.7–3.1)
Lymphs: 24 %
MCH: 32.3 pg (ref 26.6–33.0)
MCHC: 34.1 g/dL (ref 31.5–35.7)
MCV: 95 fL (ref 79–97)
Monocytes Absolute: 0.4 10*3/uL (ref 0.1–0.9)
Monocytes: 8 %
Neutrophils Absolute: 2.7 10*3/uL (ref 1.4–7.0)
Neutrophils: 59 %
Platelets: 210 10*3/uL (ref 150–450)
RBC: 4.52 x10E6/uL (ref 3.77–5.28)
RDW: 12 % (ref 11.7–15.4)
WBC: 4.6 10*3/uL (ref 3.4–10.8)

## 2021-05-25 LAB — HEMOGLOBIN A1C
Est. average glucose Bld gHb Est-mCnc: 100 mg/dL
Hgb A1c MFr Bld: 5.1 % (ref 4.8–5.6)

## 2022-05-24 ENCOUNTER — Ambulatory Visit: Payer: BC Managed Care – PPO | Admitting: Psychiatry

## 2022-07-17 ENCOUNTER — Other Ambulatory Visit: Payer: Self-pay | Admitting: General Surgery

## 2022-07-17 DIAGNOSIS — N750 Cyst of Bartholin's gland: Secondary | ICD-10-CM

## 2022-08-14 ENCOUNTER — Other Ambulatory Visit: Payer: BC Managed Care – PPO

## 2022-08-17 ENCOUNTER — Ambulatory Visit
Admission: RE | Admit: 2022-08-17 | Discharge: 2022-08-17 | Disposition: A | Discharge status: Home / Self Care | Payer: BC Managed Care – PPO | Source: Ambulatory Visit | Attending: General Surgery | Admitting: General Surgery

## 2022-08-17 DIAGNOSIS — N750 Cyst of Bartholin's gland: Secondary | ICD-10-CM

## 2022-09-12 ENCOUNTER — Other Ambulatory Visit: Payer: Self-pay | Admitting: Internal Medicine

## 2022-09-12 ENCOUNTER — Encounter: Payer: Self-pay | Admitting: Internal Medicine

## 2022-09-12 DIAGNOSIS — R928 Other abnormal and inconclusive findings on diagnostic imaging of breast: Secondary | ICD-10-CM

## 2022-09-13 ENCOUNTER — Other Ambulatory Visit: Payer: Self-pay | Admitting: Internal Medicine

## 2022-09-13 DIAGNOSIS — R928 Other abnormal and inconclusive findings on diagnostic imaging of breast: Secondary | ICD-10-CM

## 2022-10-11 IMAGING — CT CT NECK W/ CM
2 of 3 series · 7 of 14 positions shown, 8 images · IV contrast (iopamidol)
Comparison: None.

CLINICAL DATA: Laser removal of melanoma .  Adenopathy.

EXAM:
CT NECK WITH CONTRAST
TECHNIQUE: Multidetector CT imaging of the neck was performed using the
standard protocol following the bolus administration of intravenous
contrast.
CONTRAST:  75mL [id number] IOPAMIDOL ([id number]) INJECTION 61%

[Series 3: neck · axial · 0.53mm/px · z∈[-251,-119]mm · 3 of 133 slices shown, 4 images]
[im 34/133  soft-tissue]
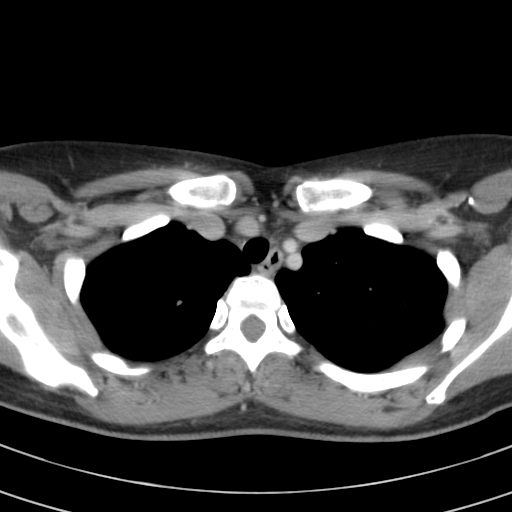
[im 34/133  bone]
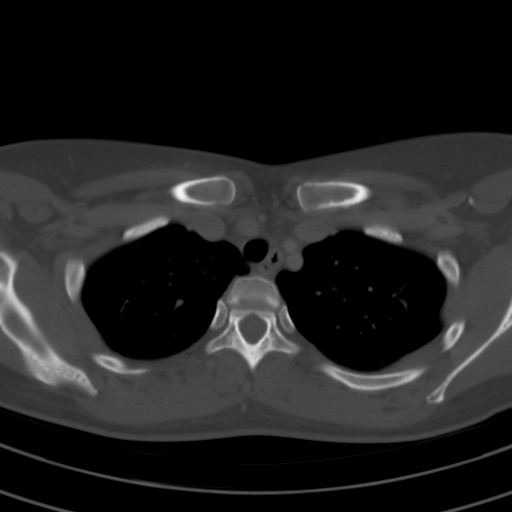
[im 67/133  bone]
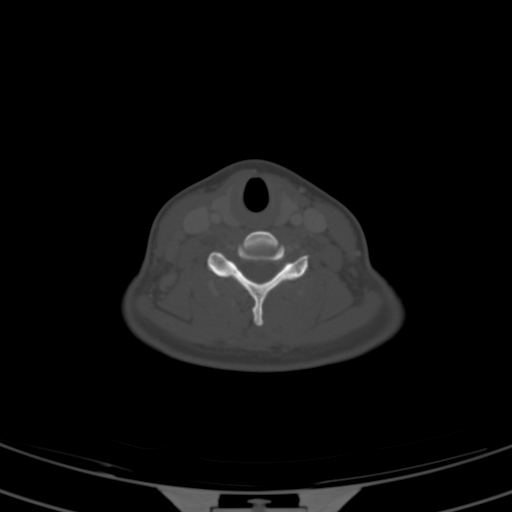
[im 100/133  bone]
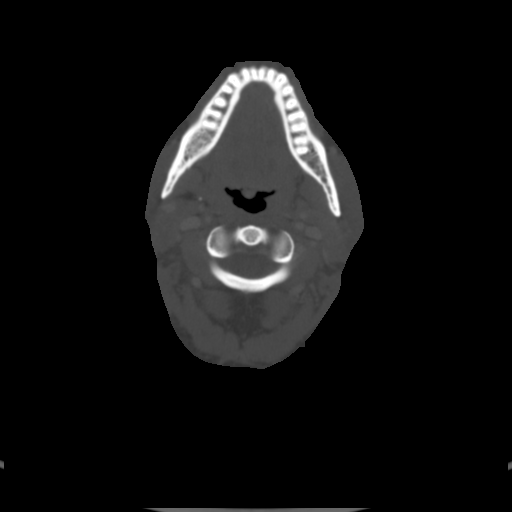

[Series 6: angled axial-oropharynx · axial · 0.39mm/px · z∈[-281,-122]mm · 4 of 136 slices shown]
[im 28/136  bone]
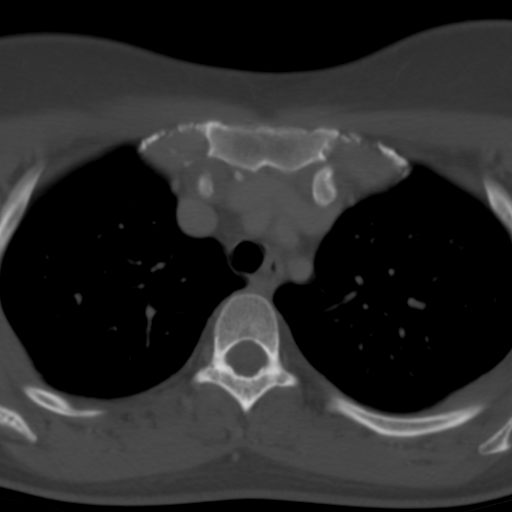
[im 55/136  bone]
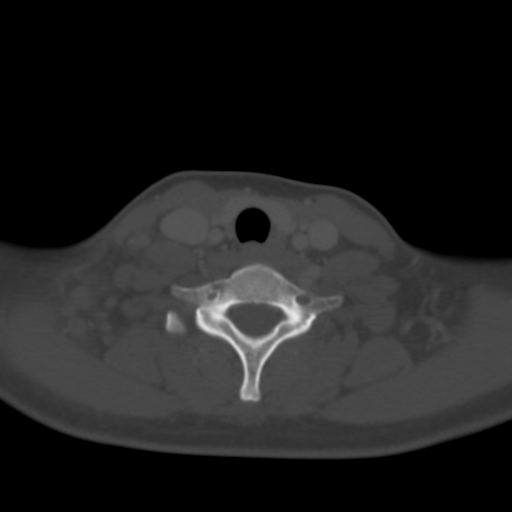
[im 82/136  bone]
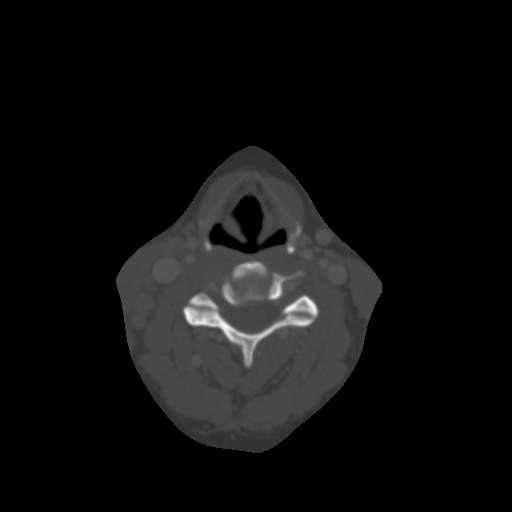
[im 109/136  bone]
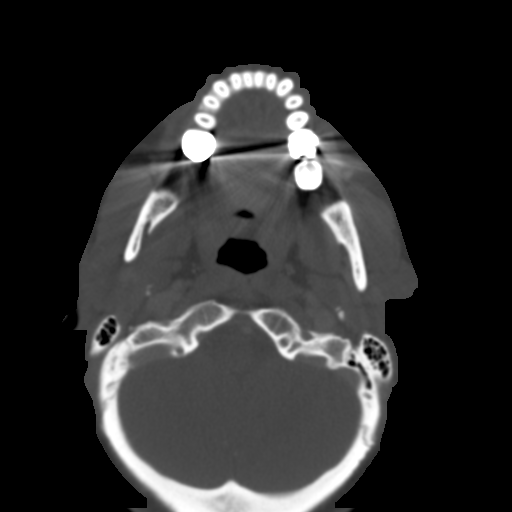

[7 of 14 positions shown; findings below may reference images not displayed]

FINDINGS: Pharynx and larynx: No mucosal or submucosal lesion.

Salivary glands: Parotid and submandibular glands are normal.

Thyroid: Normal

Lymph nodes: No abnormal lymph nodes on the left side of the neck.
On the right, there is extensive lymphadenopathy beginning at the
level 2 level 3 junction. Node anterior to the jugular vein at this
location measures 8 x 11 mm. Level 4 and level 5 lymphadenopathy on
the right is extensive, with the largest level 5 nodes image 76
measuring 8 x 11 mm and image 80 measuring 9 x 13 mm.

Vascular: No vascular pathology.

Limited intracranial: Normal

Visualized orbits: Normal

Mastoids and visualized paranasal sinuses: Clear

Skeleton: Normal

Upper chest: Mild pleural and parenchymal scarring at the lung
apices.

Other: None
IMPRESSION: Extensive lymphadenopathy on the right beginning at the level 2
level 3 junction and extending through the level 4 and level 5 nodal
stations. Node anterior to the jugular vein at the level 2 level 3
junction measures 8 x 11 mm. Level 4 and level 5 lymphadenopathy on
the right is extensive with respect to the number of abnormal nodes,
with the largest level 5 nodes image 76 measuring 8 x 11 mm and
image 80 measuring 9 x 13 mm.

## 2022-10-15 ENCOUNTER — Ambulatory Visit: Payer: BC Managed Care – PPO

## 2022-10-15 ENCOUNTER — Ambulatory Visit
Admission: RE | Admit: 2022-10-15 | Discharge: 2022-10-15 | Disposition: A | Discharge status: Home / Self Care | Payer: BC Managed Care – PPO | Source: Ambulatory Visit | Attending: Internal Medicine | Admitting: Internal Medicine

## 2022-10-15 DIAGNOSIS — R928 Other abnormal and inconclusive findings on diagnostic imaging of breast: Secondary | ICD-10-CM

## 2022-12-05 IMAGING — CT CT ABD-PELV W/ CM
1 of 2 series · 14 of 32 positions shown, 19 images · IV contrast (APPLIED)
Comparison: None.

CLINICAL DATA: Abdominal pain for 3 weeks.  Swollen lymph nodes.

EXAM:
CT ABDOMEN AND PELVIS WITH CONTRAST
TECHNIQUE: Multidetector CT imaging of the abdomen and pelvis was performed
using the standard protocol following bolus administration of
intravenous contrast.
CONTRAST:  100mL [id number] IOPAMIDOL ([id number]) INJECTION 61%

[Series 2: abd/pelvis w/cm · axial · 0.72mm/px · z∈[-388,+2]mm · 14 of 88 slices shown, 19 images]
[im 5/88  soft-tissue]
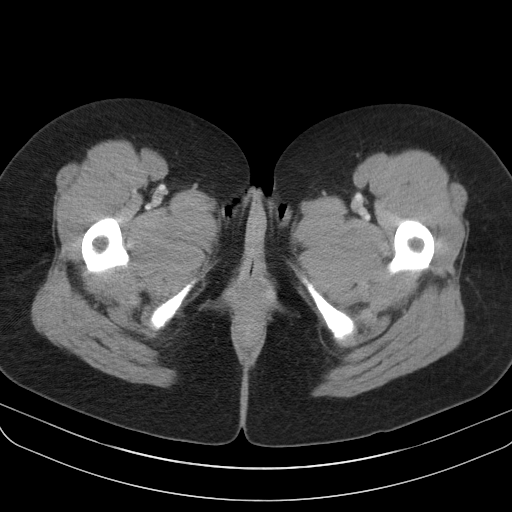
[im 5/88  bone]
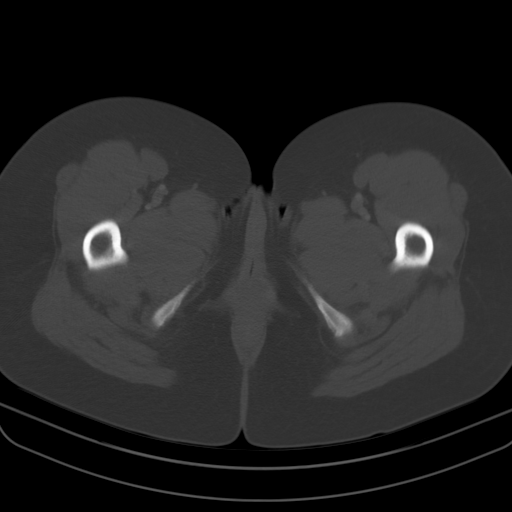
[im 14/88  soft-tissue]
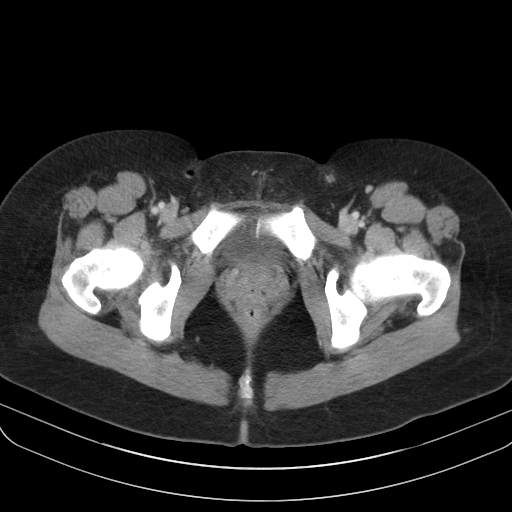
[im 18/88  soft-tissue]
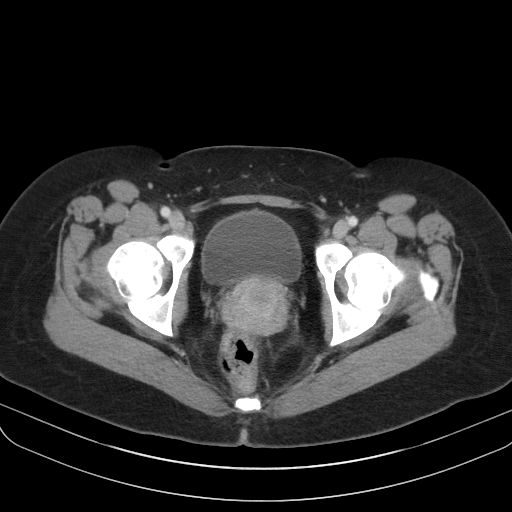
[im 27/88  soft-tissue]
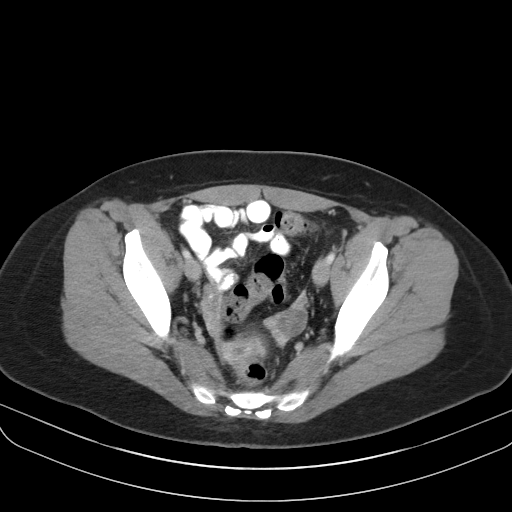
[im 31/88  soft-tissue]
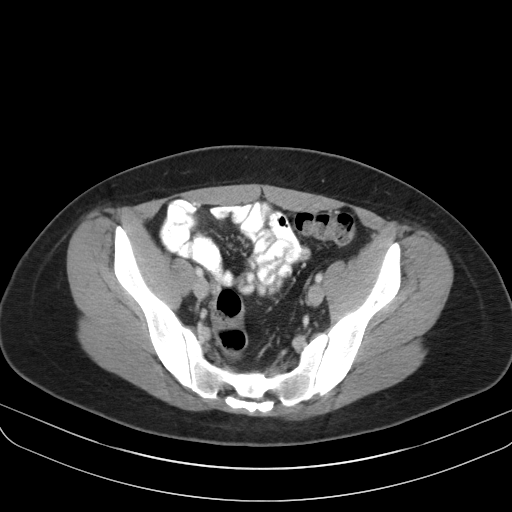
[im 40/88  soft-tissue]
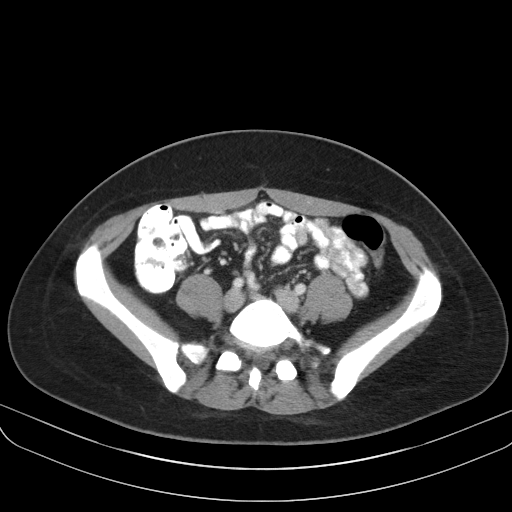
[im 44/88  soft-tissue]
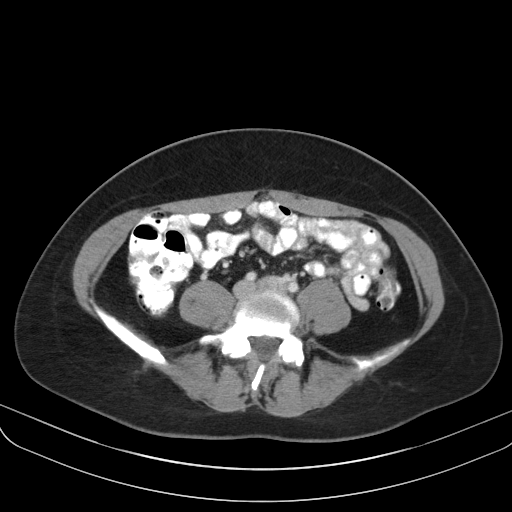
[im 48/88  soft-tissue]
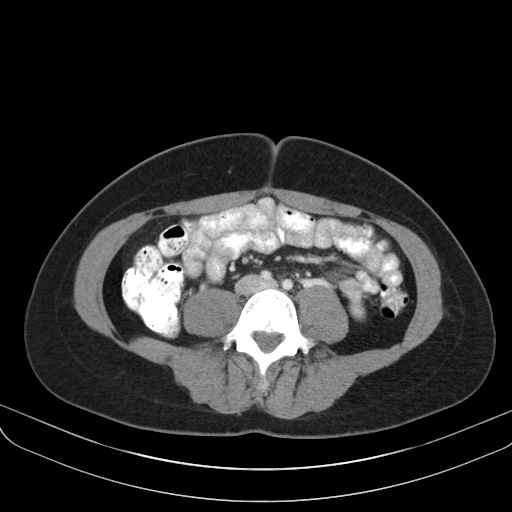
[im 57/88  soft-tissue]
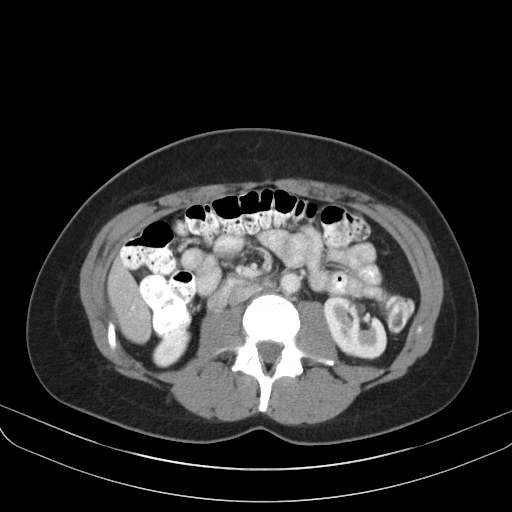
[im 57/88  bone]
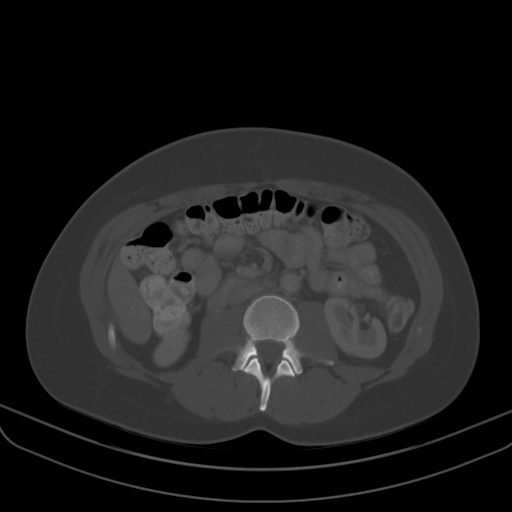
[im 61/88  soft-tissue]
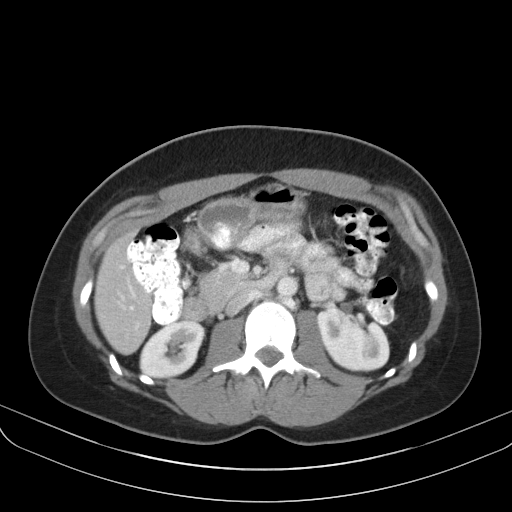
[im 70/88  soft-tissue]
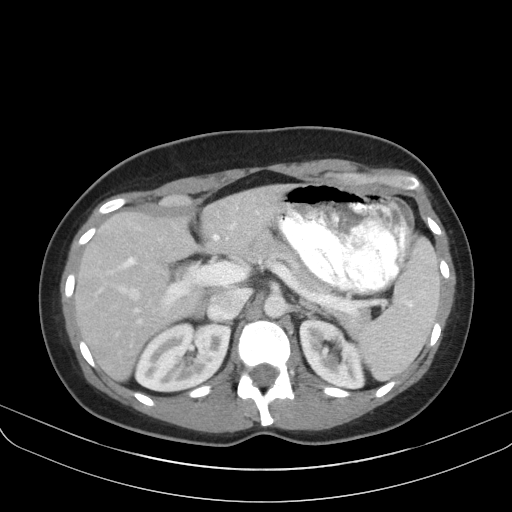
[im 70/88  lung]
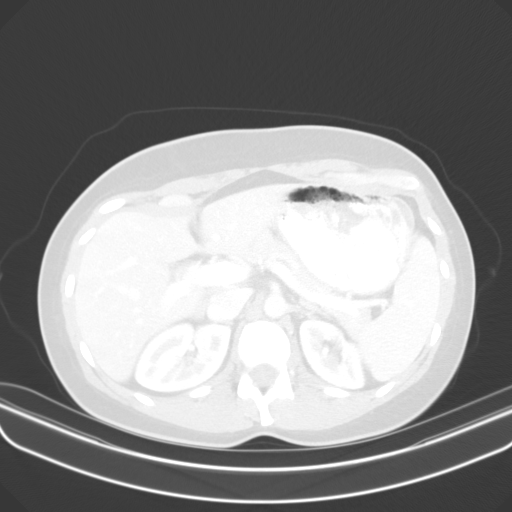
[im 74/88  soft-tissue]
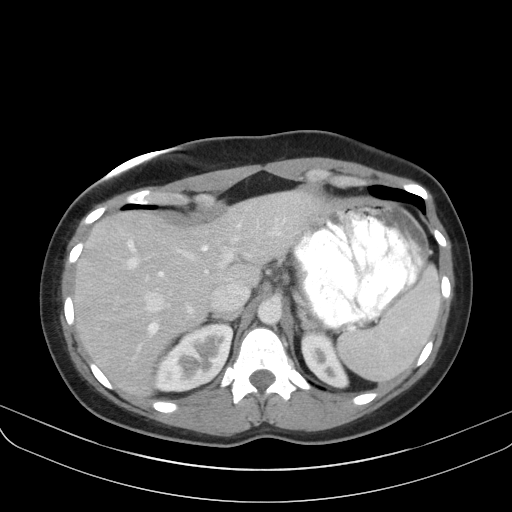
[im 74/88  lung]
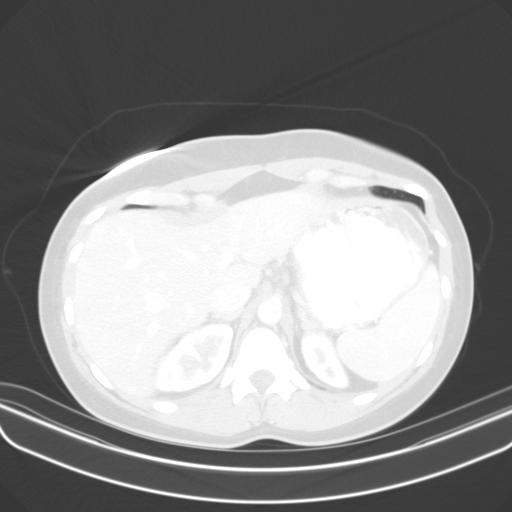
[im 79/88  lung]
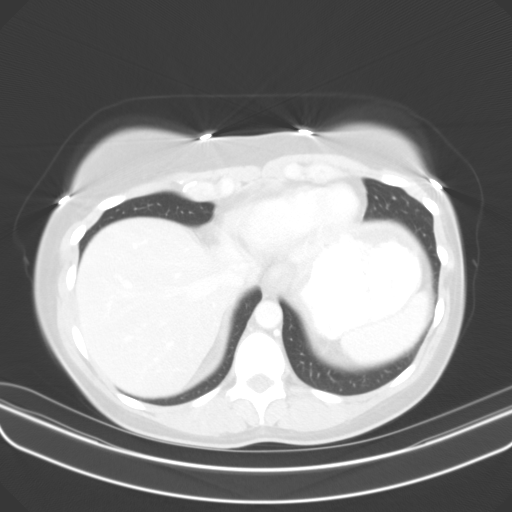
[im 83/88  soft-tissue]
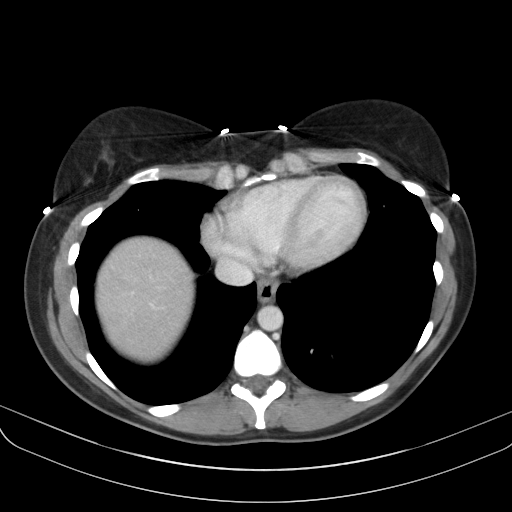
[im 83/88  lung]
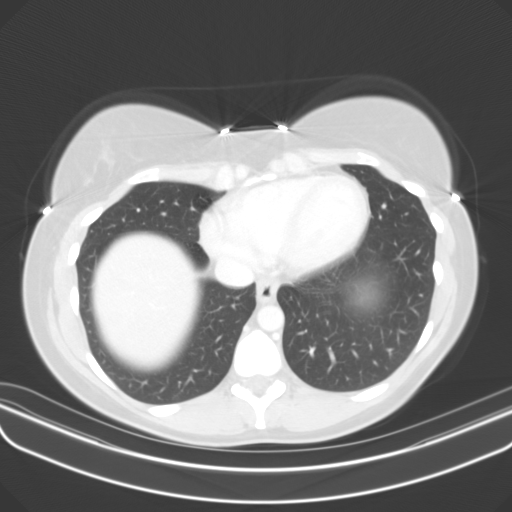

[14 of 32 positions shown; findings below may reference images not displayed]

FINDINGS: Lower Chest: No acute findings.

Hepatobiliary: No hepatic masses identified. Tiny sub-cm cyst noted
in anterior left lobe. Gallbladder is unremarkable. No evidence of
biliary ductal dilatation.

Pancreas:  No mass or inflammatory changes.

Spleen: Within normal limits in size and appearance.

Adrenals/Urinary Tract: No masses identified. No evidence of
ureteral calculi or hydronephrosis.

Stomach/Bowel: No evidence of obstruction, inflammatory process or
abnormal fluid collections. Normal appendix visualized.

Vascular/Lymphatic: No pathologically enlarged lymph nodes. No
abdominal aortic aneurysm.

Reproductive:  No mass or other significant abnormality.

Other:  None.

Musculoskeletal:  No suspicious bone lesions identified.
IMPRESSION: Negative. No acute findings or other significant abnormality.

## 2023-02-15 ENCOUNTER — Ambulatory Visit (INDEPENDENT_AMBULATORY_CARE_PROVIDER_SITE_OTHER): Payer: BC Managed Care – PPO

## 2023-09-03 ENCOUNTER — Encounter: Payer: Self-pay | Admitting: Internal Medicine

## 2023-09-03 ENCOUNTER — Other Ambulatory Visit: Payer: Self-pay | Admitting: Internal Medicine

## 2023-09-03 DIAGNOSIS — R921 Mammographic calcification found on diagnostic imaging of breast: Secondary | ICD-10-CM

## 2023-10-18 ENCOUNTER — Ambulatory Visit
Admission: RE | Admit: 2023-10-18 | Discharge: 2023-10-18 | Disposition: A | Discharge status: Home / Self Care | Payer: Self-pay | Source: Ambulatory Visit | Attending: Internal Medicine | Admitting: Internal Medicine

## 2023-10-18 DIAGNOSIS — R921 Mammographic calcification found on diagnostic imaging of breast: Secondary | ICD-10-CM

## 2024-04-22 ENCOUNTER — Ambulatory Visit (HOSPITAL_BASED_OUTPATIENT_CLINIC_OR_DEPARTMENT_OTHER): Admitting: Nurse Practitioner

## 2024-04-22 ENCOUNTER — Encounter (HOSPITAL_BASED_OUTPATIENT_CLINIC_OR_DEPARTMENT_OTHER): Payer: Self-pay | Admitting: Nurse Practitioner

## 2024-04-22 VITALS — BP 118/74 | HR 69 | Ht 64.0 in | Wt 184.8 lb

## 2024-04-22 DIAGNOSIS — R0602 Shortness of breath: Secondary | ICD-10-CM | POA: Diagnosis not present

## 2024-04-22 DIAGNOSIS — Z8249 Family history of ischemic heart disease and other diseases of the circulatory system: Secondary | ICD-10-CM | POA: Diagnosis not present

## 2024-04-22 DIAGNOSIS — I493 Ventricular premature depolarization: Secondary | ICD-10-CM | POA: Diagnosis not present

## 2024-04-22 DIAGNOSIS — Z7189 Other specified counseling: Secondary | ICD-10-CM | POA: Diagnosis not present

## 2024-04-22 NOTE — Progress Notes (Signed)
 " Cardiology Office Note:  .   Date:  04/22/2024 ID:  Lauren Fletcher, DOB 1980/08/27, MRN [id number] PCP: Cleotilde, [place]  E, PA [facility] Providers Cardiologist:  None   Patient Profile: .      PMH Family history of heart disease       History of Present Illness: .   Discussed the use of AI scribe software for clinical note transcription with the patient, who gave verbal consent to proceed.  History of Present Illness Lauren Fletcher is a very pleasant 43 year old female who presents with concerns about her cardiovascular health due to a significant family history of heart disease. She reports extensive heart disease on her father's side, including relatives with valve disease, coronary artery disease, atrial fibrillation, and a cousin with atrial fibrillation treated with ablation at age 42. Her father had significant heart failure and volume overload requiring ICU admission with atrial fibrillation, he is doing better now.  Her uncle passed away during surgery when he was found to have coronary artery disease and valve disease in his 20s.  Her brother is an designer, industrial/product at [employer] in [place], [place]. Reports weight is up about 20 lbs from her normal and she has shortness of breath with routine activities such as vacuuming, along with episodes of elevated resting heart rate.  Feels weight gain was exacerbated by allergic reaction that required 3 rounds of steroids earlier this year. Admits she is not consistently following a heart-healthy diet and has low motivation for regular exercise, though she walks her dog. Her May lipid panel was normal, with total cholesterol 142, HDL 60, triglycerides 68, and LDL 69. 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   Family history: Her family history includes Atrial  fibrillation in her cousin, father, and paternal uncle; CAD in her paternal uncle; Cancer in her maternal grandfather and paternal grandfather; Healthy in her brother and mother; Heart failure in her father; Valvular heart disease in her paternal grandfather and paternal uncle.  Paternal side of family  Uncle died during heart surgery at age 86 Father - developed a fib and was in ICU for heart failure, volume overload   ASCVD Risk Score: ASCVD (Atherosclerotic Cardiovascular Disease) Risk Algorithm including Known ASCVD from AHA/ACC from [email or web address] on 04/22/2024 ** All calculations should be rechecked by clinician prior to use **  RESULT SUMMARY: 0.2 % Risk of cardiovascular event (coronary or stroke death or non-fatal MI or stroke) in next 10 years.  No statin recommended because 10-year risk <5%; always encourage healthy cardiovascular lifestyle choices. Some patients with other high risk features may still be appropriate for treatment.  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  No results found for: LIPOA   ROS: See HPI       Studies Reviewed: SABRA   EKG Interpretation Date/Time:  Wednesday April 22 2024 10:19:28 EST Ventricular Rate:  69 PR Interval:  156  QRS Duration:  80 QT Interval:  382 QTC Calculation: 409 R Axis:   57  Text Interpretation: Normal sinus rhythm Normal ECG When compared with ECG of 25-Sep-2020 20:10, No significant change was found Confirmed by Percy Browning [phone]) on 04/22/2024 10:26:57 AM      Risk Assessment/Calculations:              Physical Exam:   VS: BP 118/74 (BP Location: Right Arm, Patient Position: Sitting, Cuff Size: Large)   Pulse 69   Ht 5' 4 (1.626 m)   Wt 184 lb 12.8 oz (83.8 kg)   SpO2 98%   BMI 31.72 kg/m   Wt Readings from Last 3 Encounters:  04/22/24 184 lb 12.8 oz (83.8 kg)  05/24/21 166 lb (75.3 kg)  01/24/21 160 lb (72.6 kg)     GEN: Well nourished, well developed in no acute distress NECK: No JVD; No carotid bruits CARDIAC: RRR, no murmurs, rubs, gallops RESPIRATORY:  Clear to auscultation without rales, wheezing or rhonchi  ABDOMEN: Soft, non-tender, non-distended EXTREMITIES:  No edema; No deformity     ASSESSMENT AND PLAN: .    Assessment & Plan Cardiovascular risk assessment  Family history of arrhythmia and valvular disease  Shortness of breath  Her family history includes atrial fibrillation, coronary artery disease, and valve replacements. She reports exertional dyspnea and occasional elevated resting heart rate. EKG showed normal sinus rhythm with one PVC.  We discussed screening for CAD and valve abnormalities. Reviewed additional potential causes of shortness of breath. Discussed potential for AFib and its associated risks.  - Order echocardiogram to assess heart function and valve structure - Obtain blood tests including lipoprotein A, cholesterol panel, high sensitivity C-reactive protein, and insulin sensitivity for further risk stratification - Heart healthy diet avoiding processed foods, saturated fat, sugar, and other simple carbohydrates encouraged - Be as physically active as possible every day and aim for at least 150 minutes of moderate intensity exercise each week  PVC PVC noted on rhythm strip which was conducted after EKG.  We reviewed this in detail and how it differs from atrial fibrillation.  She is asymptomatic. - Continue to monitor clinically for now  Obesity   BMI is 31.72.  She reports 20 pound weight gain over the past few months following 3  rounds of steroid therapy for allergic reaction.  Typical weight is 160 lb. She admits to poor dietary habits and lack of exercise. We discussed healthy lifestyle recommendations for cardiovascular health.  - Heart healthy diet avoiding processed foods, saturated fat, sugar, and other simple carbohydrates encouraged - Be as physically active as possible every day and aim for at least 150 minutes of moderate intensity exercise each week     Dispo: TBD following echocardiogram and lab results  Signed, Browning Percy, NP-C "

## 2024-04-22 NOTE — Patient Instructions (Signed)
 Medication Instructions:   Your physician recommends that you continue on your current medications as directed. Please refer to the Current Medication list given to you today.   *If you need a refill on your cardiac medications before your next appointment, please call your pharmacy*  Lab Work:  TODAY!!!!   If you have labs (blood work) drawn today and your tests are completely normal, you will receive your results only by: MyChart Message (if you have MyChart) OR A paper copy in the mail If you have any lab test that is abnormal or we need to change your treatment, we will call you to review the results.  Testing/Procedures:  Your physician has requested that you have an echocardiogram. Echocardiography is a painless test that uses sound waves to create images of your heart. It provides your doctor with information about the size and shape of your heart and how well your hearts chambers and valves are working. This procedure takes approximately one hour. There are no restrictions for this procedure. Please do NOT wear cologne, perfume or lotions (deodorant is allowed). Please arrive 15 minutes prior to your appointment time.   Follow-Up: At [facility], you and your health needs are our priority.  As part of our continuing mission to provide you with exceptional heart care, our providers are all part of one team.  This team includes your primary Cardiologist (physician) and Advanced Practice Providers or APPs (Physician Assistants and Nurse Practitioners) who all work together to provide you with the care you need, when you need it.  Your next appointment:   As needed   Provider:   Rosaline Bane, NP    We recommend signing up for the patient portal called MyChart.  Sign up information is provided on this After Visit Summary.  MyChart is used to connect with patients for Virtual Visits (Telemedicine).  Patients are able to view lab/test results, encounter notes,  upcoming appointments, etc.  Non-urgent messages can be sent to your provider as well.   To learn more about what you can do with MyChart, go to [email or web address].

## 2024-04-23 LAB — LIPID PANEL
Chol/HDL Ratio: 2.9 ratio (ref 0.0–4.4)
Cholesterol, Total: 174 mg/dL (ref 100–199)
HDL: 61 mg/dL
LDL Chol Calc (NIH): 102 mg/dL — ABNORMAL HIGH (ref 0–99)
Triglycerides: 55 mg/dL (ref 0–149)
VLDL Cholesterol Cal: 11 mg/dL (ref 5–40)

## 2024-04-23 LAB — LIPOPROTEIN A (LPA): Lipoprotein (a): 10.8 nmol/L

## 2024-04-23 LAB — HIGH SENSITIVITY CRP: CRP, High Sensitivity: 2.61 mg/L (ref 0.00–3.00)

## 2024-04-27 ENCOUNTER — Ambulatory Visit (HOSPITAL_BASED_OUTPATIENT_CLINIC_OR_DEPARTMENT_OTHER): Payer: Self-pay | Admitting: Nurse Practitioner

## 2024-05-13 ENCOUNTER — Other Ambulatory Visit (INDEPENDENT_AMBULATORY_CARE_PROVIDER_SITE_OTHER)

## 2024-05-13 DIAGNOSIS — Z7189 Other specified counseling: Secondary | ICD-10-CM

## 2024-05-13 DIAGNOSIS — Z8249 Family history of ischemic heart disease and other diseases of the circulatory system: Secondary | ICD-10-CM

## 2024-05-13 LAB — ECHOCARDIOGRAM COMPLETE
Area-P 1/2: 4.21 cm2
S' Lateral: 2.71 cm
